# Patient Record
Sex: Female | Born: 1935
Health system: Southern US, Community
[De-identification: ages and names within clinical notes are randomized; demographics above are authoritative.]

## PROBLEM LIST (undated history)

## (undated) DIAGNOSIS — I10 Essential (primary) hypertension: Secondary | ICD-10-CM

## (undated) DIAGNOSIS — E785 Hyperlipidemia, unspecified: Secondary | ICD-10-CM

## (undated) DIAGNOSIS — Z853 Personal history of malignant neoplasm of breast: Secondary | ICD-10-CM

## (undated) DIAGNOSIS — C801 Malignant (primary) neoplasm, unspecified: Secondary | ICD-10-CM

## (undated) DIAGNOSIS — F0150 Vascular dementia without behavioral disturbance: Secondary | ICD-10-CM

## (undated) DIAGNOSIS — R32 Unspecified urinary incontinence: Secondary | ICD-10-CM

## (undated) HISTORY — PX: APPENDECTOMY: SHX54

## (undated) HISTORY — DX: Personal history of malignant neoplasm of breast: Z85.3

## (undated) HISTORY — DX: Unspecified urinary incontinence: R32

## (undated) HISTORY — PX: BREAST LUMPECTOMY: SHX2

---

## 2010-05-15 ENCOUNTER — Emergency Department: Payer: Self-pay | Admitting: Emergency Medicine

## 2011-01-29 ENCOUNTER — Emergency Department: Payer: Self-pay | Admitting: Emergency Medicine

## 2011-05-12 ENCOUNTER — Emergency Department: Payer: Self-pay | Admitting: Internal Medicine

## 2015-03-23 ENCOUNTER — Encounter: Payer: Self-pay | Admitting: Emergency Medicine

## 2015-03-23 ENCOUNTER — Emergency Department: Payer: Medicare Other

## 2015-03-23 ENCOUNTER — Observation Stay
Admission: EM | Admit: 2015-03-23 | Discharge: 2015-03-24 | Disposition: A | Discharge status: Home / Self Care | Payer: Medicare Other | Attending: Internal Medicine | Admitting: Internal Medicine

## 2015-03-23 DIAGNOSIS — Z66 Do not resuscitate: Secondary | ICD-10-CM | POA: Insufficient documentation

## 2015-03-23 DIAGNOSIS — Z809 Family history of malignant neoplasm, unspecified: Secondary | ICD-10-CM | POA: Insufficient documentation

## 2015-03-23 DIAGNOSIS — Z853 Personal history of malignant neoplasm of breast: Secondary | ICD-10-CM | POA: Insufficient documentation

## 2015-03-23 DIAGNOSIS — R55 Syncope and collapse: Secondary | ICD-10-CM | POA: Diagnosis present

## 2015-03-23 DIAGNOSIS — I10 Essential (primary) hypertension: Secondary | ICD-10-CM | POA: Diagnosis not present

## 2015-03-23 DIAGNOSIS — I6523 Occlusion and stenosis of bilateral carotid arteries: Secondary | ICD-10-CM | POA: Diagnosis not present

## 2015-03-23 DIAGNOSIS — R350 Frequency of micturition: Secondary | ICD-10-CM | POA: Insufficient documentation

## 2015-03-23 DIAGNOSIS — M79601 Pain in right arm: Secondary | ICD-10-CM | POA: Insufficient documentation

## 2015-03-23 DIAGNOSIS — Z79899 Other long term (current) drug therapy: Secondary | ICD-10-CM | POA: Insufficient documentation

## 2015-03-23 DIAGNOSIS — Z8249 Family history of ischemic heart disease and other diseases of the circulatory system: Secondary | ICD-10-CM | POA: Insufficient documentation

## 2015-03-23 DIAGNOSIS — E785 Hyperlipidemia, unspecified: Secondary | ICD-10-CM | POA: Diagnosis present

## 2015-03-23 HISTORY — DX: Hyperlipidemia, unspecified: E78.5

## 2015-03-23 HISTORY — DX: Essential (primary) hypertension: I10

## 2015-03-23 LAB — BASIC METABOLIC PANEL
Anion gap: 9 (ref 5–15)
BUN: 8 mg/dL (ref 6–20)
CO2: 28 mmol/L (ref 22–32)
Calcium: 9.3 mg/dL (ref 8.9–10.3)
Chloride: 97 mmol/L — ABNORMAL LOW (ref 101–111)
Creatinine, Ser: 0.69 mg/dL (ref 0.44–1.00)
GFR calc Af Amer: >60 mL/min (ref >60)
GFR calc non Af Amer: >60 mL/min (ref >60)
Glucose, Bld: 111 mg/dL — ABNORMAL HIGH (ref 65–99)
Potassium: 3.4 mmol/L — ABNORMAL LOW (ref 3.5–5.1)
Sodium: 134 mmol/L — ABNORMAL LOW (ref 135–145)

## 2015-03-23 LAB — CBC
HCT: 42.6 % (ref 35.0–47.0)
Hemoglobin: 13.9 g/dL (ref 12.0–16.0)
MCH: 28.4 pg (ref 26.0–34.0)
MCHC: 32.7 g/dL (ref 32.0–36.0)
MCV: 86.9 fL (ref 80.0–100.0)
Platelets: 234 10*3/uL (ref 150–440)
RBC: 4.9 MIL/uL (ref 3.80–5.20)
RDW: 14.2 % (ref 11.5–14.5)
WBC: 7.1 10*3/uL (ref 3.6–11.0)

## 2015-03-23 LAB — TROPONIN I: Troponin I: <0.03 ng/mL (ref <0.031)

## 2015-03-23 LAB — GLUCOSE, CAPILLARY: Glucose-Capillary: 96 mg/dL (ref 65–99)

## 2015-03-23 MED ORDER — LOSARTAN POTASSIUM 50 MG PO TABS
100.0000 mg | ORAL_TABLET | Freq: Every day | ORAL | Status: DC
  Administered 2015-03-24: 100 mg via ORAL
  Filled 2015-03-23: qty 2

## 2015-03-23 MED ORDER — ONDANSETRON HCL 4 MG PO TABS: 4.0000 mg | ORAL_TABLET | Freq: Four times a day (QID) | ORAL | Status: DC | PRN

## 2015-03-23 MED ORDER — SODIUM CHLORIDE 0.9 % IV SOLN
INTRAVENOUS | Status: AC
  Administered 2015-03-23: 23:00:00 via INTRAVENOUS

## 2015-03-23 MED ORDER — HYDROCHLOROTHIAZIDE 25 MG PO TABS
25.0000 mg | ORAL_TABLET | Freq: Every day | ORAL | Status: DC
  Administered 2015-03-24: 25 mg via ORAL
  Filled 2015-03-23: qty 1

## 2015-03-23 MED ORDER — ONDANSETRON HCL 4 MG/2ML IJ SOLN: 4.0000 mg | Freq: Four times a day (QID) | INTRAMUSCULAR | Status: DC | PRN

## 2015-03-23 MED ORDER — ENOXAPARIN SODIUM 40 MG/0.4ML ~~LOC~~ SOLN: 40.0000 mg | SUBCUTANEOUS | Status: DC

## 2015-03-23 MED ORDER — LOSARTAN POTASSIUM-HCTZ 100-25 MG PO TABS: 1.0000 | ORAL_TABLET | Freq: Every day | ORAL | Status: DC

## 2015-03-23 MED ORDER — ACETAMINOPHEN 325 MG PO TABS: 650.0000 mg | ORAL_TABLET | Freq: Four times a day (QID) | ORAL | Status: DC | PRN

## 2015-03-23 MED ORDER — ACETAMINOPHEN 650 MG RE SUPP: 650.0000 mg | Freq: Four times a day (QID) | RECTAL | Status: DC | PRN

## 2015-03-23 MED ORDER — SENNOSIDES-DOCUSATE SODIUM 8.6-50 MG PO TABS: 1.0000 | ORAL_TABLET | Freq: Every evening | ORAL | Status: DC | PRN

## 2015-03-23 MED ORDER — ATORVASTATIN CALCIUM 10 MG PO TABS: 10.0000 mg | ORAL_TABLET | Freq: Every day | ORAL | Status: DC

## 2015-03-23 MED ORDER — SODIUM CHLORIDE 0.9 % IJ SOLN
3.0000 mL | Freq: Two times a day (BID) | INTRAMUSCULAR | Status: DC
  Administered 2015-03-23: 3 mL via INTRAVENOUS

## 2015-03-23 NOTE — ED Notes (Signed)
Skin tear on right upper arm cleaned with sterile saline and dressed with tegaderm.  Laceration on right forearm cleaned with sterile saline and dressed with large adhesive bandage.

## 2015-03-23 NOTE — ED Notes (Signed)
Pt to ED via EMS from [employer] after syncopal episode, pt's spouse states pt hit her head on floor when she had syncopal episode, pt also obtained abrasion to right upper arm that was bandaged by EMS, pt a&o x 4 at present, states she has not medical hx

## 2015-03-23 NOTE — ED Provider Notes (Signed)
[facility] Emergency Department Provider Note  ____________________________________________  Time seen: Approximately 7:06 PM  I have reviewed the triage vital signs and the nursing notes.   HISTORY  Chief Complaint Loss of Consciousness    HPI Robin Lynn is a 81 y.o. female with HTN, history of breast cancer who presents for evaluation of sudden onset syncope without prodrome which occurred just prior to arrival, now resolved. The patient was walking with her husband at [facility] when she suddenly collapsed falling forward and hitting her head. She is also complaining of right forearm pain related to the fall. No neck pain. She is endorsing increased urinary frequency. She denies any chest pain, lightheadedness, nausea, flushing or shortness of breath preceding the event. She has otherwise been in her usual state of health. Currently she has mild pain in the right upper arm. It is worse with movement. She's never had anything like this before.    Past Medical History  Diagnosis Date  . Breast CA     There are no active problems to display for this patient.   Past Surgical History  Procedure Laterality Date  . Appendectomy      Current Outpatient Rx  Name  Route  Sig  Dispense  Refill  . atorvastatin (LIPITOR) 10 MG tablet   Oral   Take 10 mg by mouth daily.      3   . losartan-hydrochlorothiazide (HYZAAR) 100-25 MG per tablet   Oral   Take 1 tablet by mouth daily.      3     Allergies Review of patient's allergies indicates no known allergies.  No family history on file.  Social History Social History  Substance Use Topics  . Smoking status: Never Smoker   . Smokeless tobacco: None  . Alcohol Use: 0.6 oz/week    1 Glasses of wine per week    Review of Systems Constitutional: No fever/chills Eyes: No visual changes. ENT: No sore throat. Cardiovascular: Denies chest pain. Respiratory: Denies shortness of  breath. Gastrointestinal: No abdominal pain.  No nausea, no vomiting.  No diarrhea.  No constipation. Genitourinary: Negative for dysuria. Musculoskeletal: Negative for back pain. Skin: Negative for rash. Neurological: Negative for headaches, focal weakness or numbness.  10-point ROS otherwise negative.  ____________________________________________   PHYSICAL EXAM:  VITAL SIGNS: ED Triage Vitals  Enc Vitals Group     BP 03/23/15 1528 186/88 mmHg     Pulse Rate 03/23/15 1528 74     Resp --      Temp 03/23/15 1528 98.1 F (36.7 C)     Temp Source 03/23/15 1528 Oral     SpO2 03/23/15 1528 98 %     Weight 03/23/15 1528 160 lb (72.576 kg)     Height 03/23/15 1528 5\' 8"  (1.727 m)     Head Cir --      Peak Flow --      Pain Score 03/23/15 1528 8     Pain Loc --      Pain Edu? --      Excl. in [place]? --     Constitutional: Alert and oriented. Well appearing and in no acute distress. Eyes: Conjunctivae are normal. PERRL. EOMI. Head: Atraumatic. Nose: No congestion/rhinnorhea. Mouth/Throat: Mucous membranes are moist.  Oropharynx non-erythematous. Neck: No stridor.  No cervical spine tenderness to palpation. Cardiovascular: Normal rate, regular rhythm. Grossly normal heart sounds.  Good peripheral circulation. Respiratory: Normal respiratory effort.  No retractions. Lungs CTAB. Gastrointestinal: Soft and  nontender. No distention. No abdominal bruits. No CVA tenderness. Genitourinary: deferred Musculoskeletal: No lower extremity tenderness nor edema. Small skin tear associated with the right elbow but no bony tenderness or deformity, full range of motion at the right elbow, 2+ right radial pulse, small abrasion on the dorsum/ulnar side of the right distal forearm without any associated tenderness. Neurologic:  Normal speech and language. No gross focal neurologic deficits are appreciated. 5 out of 5 strength in bilateral upper and lower extremity, sensation intact to light touch  throughout, CN II-XII intact. Skin:  Skin is warm, dry and intact. No rash noted. Psychiatric: Mood and affect are normal. Speech and behavior are normal.  ____________________________________________   LABS (all labs ordered are listed, but only abnormal results are displayed)  Labs Reviewed  BASIC METABOLIC PANEL - Abnormal; Notable for the following:    Sodium 134 (*)    Potassium 3.4 (*)    Chloride 97 (*)    Glucose, Bld 111 (*)    All other components within normal limits  CBC  TROPONIN I  GLUCOSE, CAPILLARY  URINALYSIS COMPLETEWITH MICROSCOPIC ([facility] ONLY)  CBG MONITORING, ED   ____________________________________________  EKG  ED ECG REPORT I, Joanne Gavel, the attending physician, personally viewed and interpreted this ECG.   Date: 03/23/2015  EKG Time: 15:47  Rate: 72  Rhythm: normal sinus rhythm  Axis: normal  Intervals:none  ST&T Change: No acute ST elevation. Q-wave in lead 3  ____________________________________________  RADIOLOGY  CXR FINDINGS: Lungs clear. Heart size and pulmonary vascularity are within normal limits. There is atherosclerotic calcification in the aorta. No adenopathy. No pneumothorax. No bone lesions.  IMPRESSION: No edema or consolidation.  CT head  IMPRESSION: Severe age-related involutional change with no acute findings  Right humerus xray FINDINGS: There are minimal degenerate changes over the [facility] joint and glenohumeral joints. There is mild diffuse decreased bone mineralization. There is no acute fracture or dislocation.  IMPRESSION: No acute findings.   ____________________________________________   PROCEDURES  Procedure(s) performed: None  Critical Care performed: No  ____________________________________________   INITIAL IMPRESSION / ASSESSMENT AND PLAN / ED COURSE  Pertinent labs & imaging results that were available during my care of the patient were reviewed by me and considered in my medical  decision making (see chart for details).  Robin Lynn is a 81 y.o. female with HTN, history of breast cancer who presents for evaluation of sudden onset syncope without prodrome. On exam, she is very well-appearing in no acute distress. Vital signs stable, she is afebrile. Intact Neurological examination and her exam is only remarkable for small skin tears associated with the right elbow and the right distal forearm. She reports she has received a tetanus vaccine within the last 5 years. EKG reassuring as are basic labs, troponin negative however given sudden onset without prodrome, my concern is for possible transient arrhythmia/cardiogenic causes of syncope. For this reason, I discussed the case with the hospitalist, Dr. Margaretmary Eddy at 8:05 pm  for admission for telemetry/monitoring. Not consistent with acute neurogenic syncope, intact neurological exam, CT head shows no acute findings. Not consistent with vasovagal syncope. Urinalysis is pending at this time. ____________________________________________   FINAL CLINICAL IMPRESSION(S) / ED DIAGNOSES  Final diagnoses:  Syncope and collapse      Joanne Gavel, MD 03/23/15 2016

## 2015-03-23 NOTE — H&P (Signed)
[place] [facility] [place] NAME: Tanga Gloor    MR#:  [id number]  DATE OF BIRTH:  [birth year]  DATE OF ADMISSION:  03/23/2015  PRIMARY CARE PHYSICIAN: Thersa Salt, DO   REQUESTING/REFERRING PHYSICIAN: Edd Fabian, M.D.  CHIEF COMPLAINT:   Chief Complaint  Patient presents with  . Loss of Consciousness    HISTORY OF PRESENT ILLNESS:  Earl Zellmer  is a 81 y.o. female who presents with syncope. Patient states that she was walking in the grocery store with her husband she had syncopal episode. She and her husband state that they do not feel like she lost consciousness, or if she didn't suffer a very brief period of time as her husband states that he was telling her to speak to him, and she was confused very briefly but was able to converse with him. Patient states that she did not have any sensation of warning such as seeing spots, or feeling lightheaded. She denies any chest pain or shortness of breath or palpitations. She has never had an episode like this before. There is no reported seizure activity or postictal state, the patient did not lose control of her bowel and bladder. Patient states she currently feels fine, but would like to be evaluated for this episode. Hospitalists were called for admission for workup for evaluation of syncope. Initial labs and imaging in the ED were all largely benign.  PAST MEDICAL HISTORY:   Past Medical History  Diagnosis Date  . Breast CA   . HTN (hypertension)   . HLD (hyperlipidemia)     PAST SURGICAL HISTORY:   Past Surgical History  Procedure Laterality Date  . Appendectomy    . Breast lumpectomy Left     with radiation    SOCIAL HISTORY:   Social History  Substance Use Topics  . Smoking status: Never Smoker   . Smokeless tobacco: Not on file  . Alcohol Use: 0.6 oz/week    1 Glasses of wine per week    FAMILY HISTORY:   Family History  Problem Relation Age of Onset  . Cancer    . CAD  Father     DRUG ALLERGIES:  No Known Allergies  MEDICATIONS AT HOME:   Prior to Admission medications   Medication Sig Start Date End Date Taking? Authorizing Provider  atorvastatin (LIPITOR) 10 MG tablet Take 10 mg by mouth daily. 02/19/15  Yes Historical Provider, MD  losartan-hydrochlorothiazide (HYZAAR) 100-25 MG per tablet Take 1 tablet by mouth daily. 02/17/15  Yes Historical Provider, MD    REVIEW OF SYSTEMS:  Review of Systems  Constitutional: Negative for fever, chills, weight loss and malaise/fatigue.  HENT: Negative for ear pain, hearing loss and tinnitus.   Eyes: Negative for blurred vision, double vision, pain and redness.  Respiratory: Negative for cough, hemoptysis and shortness of breath.   Cardiovascular: Negative for chest pain, palpitations, orthopnea and leg swelling.  Gastrointestinal: Negative for nausea, vomiting, abdominal pain, diarrhea and constipation.  Genitourinary: Negative for dysuria, frequency and hematuria.  Musculoskeletal: Negative for back pain, joint pain and neck pain.  Skin:       No acne, rash, or lesions  Neurological: Negative for dizziness, tremors, focal weakness and weakness.       Syncope  Endo/Heme/Allergies: Negative for polydipsia. Does not bruise/bleed easily.  Psychiatric/Behavioral: Negative for depression. The patient is not nervous/anxious and does not have insomnia.      VITAL SIGNS:   Filed Vitals:   03/23/15  1528 03/23/15 1914 03/23/15 2000 03/23/15 2030  BP: 186/88 175/76 177/76 175/91  Pulse: 74 85 86 74  Temp: 98.1 F (36.7 C)     TempSrc: Oral     Resp:  16 15 16   Height: 5\' 8"  (1.727 m)     Weight: 72.576 kg (160 lb)     SpO2: 98% 99% 98% 95%   Wt Readings from Last 3 Encounters:  03/23/15 72.576 kg (160 lb)    PHYSICAL EXAMINATION:  Physical Exam  Vitals reviewed. Constitutional: She is oriented to person, place, and time. She appears well-developed and well-nourished. No distress.  HENT:  Head:  Normocephalic and atraumatic.  Mouth/Throat: Oropharynx is clear and moist.  Eyes: Conjunctivae and EOM are normal. Pupils are equal, round, and reactive to light. No scleral icterus.  Neck: Normal range of motion. Neck supple. No JVD present. No thyromegaly present.  Cardiovascular: Normal rate, regular rhythm and intact distal pulses.  Exam reveals no gallop and no friction rub.   No murmur heard. Respiratory: Effort normal and breath sounds normal. No respiratory distress. She has no wheezes. She has no rales.  GI: Soft. Bowel sounds are normal. She exhibits no distension. There is no tenderness.  Musculoskeletal: Normal range of motion. She exhibits no edema.  No arthritis, no gout  Lymphadenopathy:    She has no cervical adenopathy.  Neurological: She is alert and oriented to person, place, and time. No cranial nerve deficit.  No dysarthria, no aphasia  Skin: Skin is warm and dry. No rash noted. No erythema.  Psychiatric: She has a normal mood and affect. Her behavior is normal. Judgment and thought content normal.    LABORATORY PANEL:   CBC  Recent Labs Lab 03/23/15 1553  WBC 7.1  HGB 13.9  HCT 42.6  PLT 234   ------------------------------------------------------------------------------------------------------------------  Chemistries   Recent Labs Lab 03/23/15 1553  NA 134*  K 3.4*  CL 97*  CO2 28  GLUCOSE 111*  BUN 8  CREATININE 0.69  CALCIUM 9.3   ------------------------------------------------------------------------------------------------------------------  Cardiac Enzymes  Recent Labs Lab 03/23/15 1552  TROPONINI <0.03   ------------------------------------------------------------------------------------------------------------------  RADIOLOGY:  Ct Head Wo Contrast  03/23/2015   CLINICAL DATA:  Loss of consciousness at the grocery store and fell hit right side of head on floor, due to syncope  EXAM: CT HEAD WITHOUT CONTRAST  TECHNIQUE:  Contiguous axial images were obtained from the base of the skull through the vertex without intravenous contrast.  COMPARISON:  01/29/2011  FINDINGS: Severe diffuse atrophy and low attenuation in the deep white matter. No evidence of mass or vascular territory infarct. No hydrocephalus. No hemorrhage or extra-axial fluid. No skull fracture.  IMPRESSION: Severe age-related involutional change with no acute findings   Electronically Signed   By: Skipper Cliche M.D.   On: 03/23/2015 16:12   Dg Chest Portable 1 View  03/23/2015   CLINICAL DATA:  Pain following fall  EXAM: PORTABLE CHEST - 1 VIEW  COMPARISON:  May 15, 2010  FINDINGS: Lungs clear. Heart size and pulmonary vascularity are within normal limits. There is atherosclerotic calcification in the aorta. No adenopathy. No pneumothorax. No bone lesions.  IMPRESSION: No edema or consolidation.   Electronically Signed   By: Lowella Grip III M.D.   On: 03/23/2015 19:43   Dg Humerus Right  03/23/2015   CLINICAL DATA:  Fall today with right arm pain upper arm to elbow.  EXAM: RIGHT HUMERUS - 2+ VIEW  COMPARISON:  None.  FINDINGS:  There are minimal degenerate changes over the [facility] joint and glenohumeral joints. There is mild diffuse decreased bone mineralization. There is no acute fracture or dislocation.  IMPRESSION: No acute findings.   Electronically Signed   By: Marin Olp M.D.   On: 03/23/2015 16:31    EKG:   Orders placed or performed during the hospital encounter of 03/23/15  . ED EKG  . ED EKG  . EKG 12-Lead  . EKG 12-Lead    IMPRESSION AND PLAN:  Principal Problem:   Syncope - monitor with telemetry, check TSH, trend cardiac enzymes, get echocardiogram, get cardiology consult. Active Problems:   HTN (hypertension) - mildly elevated at this time, continue home meds and IV when necessary antihypertensives if needed   HLD (hyperlipidemia) - continue home dose statin  All the records are reviewed and case discussed with ED  provider. Management plans discussed with the patient and/or family.  DVT PROPHYLAXIS: SubQ lovenox  ADMISSION STATUS: Observation  CODE STATUS: DO NOT RESUSCITATE  TOTAL TIME TAKING CARE OF THIS PATIENT: 45 minutes.    Michaelyn Wall Weott 03/23/2015, 8:51 PM  [facility]  Office  [phone]  CC: Primary care physician; Thersa Salt, DO

## 2015-03-24 ENCOUNTER — Ambulatory Visit: Payer: Self-pay | Admitting: Family Medicine

## 2015-03-24 ENCOUNTER — Observation Stay: Admit: 2015-03-24 | Payer: Medicare Other

## 2015-03-24 ENCOUNTER — Observation Stay: Payer: Medicare Other

## 2015-03-24 ENCOUNTER — Telehealth: Payer: Self-pay

## 2015-03-24 ENCOUNTER — Telehealth: Payer: Self-pay | Admitting: *Deleted

## 2015-03-24 LAB — URINALYSIS COMPLETE WITH MICROSCOPIC (ARMC ONLY)
Bacteria, UA: NONE SEEN
Bilirubin Urine: NEGATIVE
Glucose, UA: NEGATIVE mg/dL
Hgb urine dipstick: NEGATIVE
Ketones, ur: NEGATIVE mg/dL
Nitrite: NEGATIVE
Protein, ur: NEGATIVE mg/dL
Specific Gravity, Urine: 1.002 — ABNORMAL LOW (ref 1.005–1.030)
pH: 8 (ref 5.0–8.0)

## 2015-03-24 LAB — CBC
HCT: 41.7 % (ref 35.0–47.0)
Hemoglobin: 13.7 g/dL (ref 12.0–16.0)
MCH: 28.3 pg (ref 26.0–34.0)
MCHC: 32.9 g/dL (ref 32.0–36.0)
MCV: 86 fL (ref 80.0–100.0)
Platelets: 230 10*3/uL (ref 150–440)
RBC: 4.84 MIL/uL (ref 3.80–5.20)
RDW: 14 % (ref 11.5–14.5)
WBC: 8.8 10*3/uL (ref 3.6–11.0)

## 2015-03-24 LAB — BASIC METABOLIC PANEL
Anion gap: 6 (ref 5–15)
BUN: 6 mg/dL (ref 6–20)
CO2: 27 mmol/L (ref 22–32)
Calcium: 8.8 mg/dL — ABNORMAL LOW (ref 8.9–10.3)
Chloride: 104 mmol/L (ref 101–111)
Creatinine, Ser: 0.66 mg/dL (ref 0.44–1.00)
GFR calc Af Amer: >60 mL/min (ref >60)
GFR calc non Af Amer: >60 mL/min (ref >60)
Glucose, Bld: 102 mg/dL — ABNORMAL HIGH (ref 65–99)
Potassium: 3.7 mmol/L (ref 3.5–5.1)
Sodium: 137 mmol/L (ref 135–145)

## 2015-03-24 LAB — TROPONIN I
Troponin I: <0.03 ng/mL (ref <0.031)
Troponin I: <0.03 ng/mL (ref <0.031)
Troponin I: <0.03 ng/mL (ref <0.031)

## 2015-03-24 LAB — TSH: TSH: 2.555 u[IU]/mL (ref 0.350–4.500)

## 2015-03-24 MED ORDER — LOSARTAN POTASSIUM 100 MG PO TABS: 100.0000 mg | ORAL_TABLET | Freq: Every day | ORAL | Status: DC

## 2015-03-24 NOTE — Telephone Encounter (Signed)
Patient will D/C from hospital 09/16 appt office visit scheduled 09/19 -Thanks

## 2015-03-24 NOTE — Progress Notes (Signed)
Robin Lynn is a 81 y.o. female  [id number]  Primary Cardiologist: Neoma Laming Reason for Consultation: Syncopal episode  HPI: This is a 81 year old pleasant white female who presented to the emergency room after having an episode while she was shopping at a grocery store apparently passed out. She is at currently alert and oriented and denies any chest pain shortness of breath or dizziness. She had no prior episodes of something like this happening and she says she is not diabetic and she had dinner and lunch as well as breakfast yesterday on time.   Review of Systems: Review of Systems  Cardiovascular: Negative for chest pain and orthopnea.  Neurological: Positive for loss of consciousness.  All other systems reviewed and are negative.     Past Medical History  Diagnosis Date  . Breast CA   . HTN (hypertension)   . HLD (hyperlipidemia)     Medications Prior to Admission  Medication Sig Dispense Refill  . atorvastatin (LIPITOR) 10 MG tablet Take 10 mg by mouth daily.  3  . losartan-hydrochlorothiazide (HYZAAR) 100-25 MG per tablet Take 1 tablet by mouth daily.  3     . atorvastatin  10 mg Oral QHS  . enoxaparin (LOVENOX) injection  40 mg Subcutaneous Q24H  . losartan  100 mg Oral Daily   And  . hydrochlorothiazide  25 mg Oral Daily  . sodium chloride  3 mL Intravenous Q12H    Infusions: . sodium chloride 50 mL/hr at 03/23/15 2304    No Known Allergies  Social History   Social History  . Marital Status: Married    Spouse Name: N/A  . Number of Children: N/A  . Years of Education: N/A   Occupational History  . Not on file.   Social History Main Topics  . Smoking status: Never Smoker   . Smokeless tobacco: Not on file  . Alcohol Use: 0.6 oz/week    1 Glasses of wine per week  . Drug Use: No  . Sexual Activity: Not on file   Other Topics Concern  . Not on file   Social History Narrative  . No narrative on file    Family History  Problem  Relation Age of Onset  . Cancer    . CAD Father     PHYSICAL EXAM: Filed Vitals:   03/24/15 0802  BP: 175/67  Pulse: 77  Temp:   Resp: 17     Intake/Output Summary (Last 24 hours) at 03/24/15 0907 Last data filed at 03/24/15 0842  Gross per 24 hour  Intake      0 ml  Output    975 ml  Net   -975 ml    General:  Well appearing. No respiratory difficulty HEENT: normal Neck: supple. no JVD. Carotids 2+ bilat; no bruits. No lymphadenopathy or thryomegaly appreciated. Cor: PMI nondisplaced. Regular rate & rhythm. No rubs, gallops or murmurs. Lungs: clear Abdomen: soft, nontender, nondistended. No hepatosplenomegaly. No bruits or masses. Good bowel sounds. Extremities: no cyanosis, clubbing, rash, edema Neuro: alert & oriented x 3, cranial nerves grossly intact. moves all 4 extremities w/o difficulty. Affect pleasant.  ECG: KG revealed normal sinus rhythm 72 bpm with some ST depression in the inferolateral leads  Results for orders placed or performed during the hospital encounter of 03/23/15 (from the past 24 hour(s))  Troponin I     Status: None   Collection Time: 03/23/15  3:52 PM  Result Value Ref Range   Troponin I <  0.03 <0.031 ng/mL  Glucose, capillary     Status: None   Collection Time: 03/23/15  3:52 PM  Result Value Ref Range   Glucose-Capillary 96 65 - 99 mg/dL  Basic metabolic panel     Status: Abnormal   Collection Time: 03/23/15  3:53 PM  Result Value Ref Range   Sodium 134 (L) 135 - 145 mmol/L   Potassium 3.4 (L) 3.5 - 5.1 mmol/L   Chloride 97 (L) 101 - 111 mmol/L   CO2 28 22 - 32 mmol/L   Glucose, Bld 111 (H) 65 - 99 mg/dL   BUN 8 6 - 20 mg/dL   Creatinine, Ser 0.69 0.44 - 1.00 mg/dL   Calcium 9.3 8.9 - 10.3 mg/dL   GFR calc non Af Amer >60 >60 mL/min   GFR calc Af Amer >60 >60 mL/min   Anion gap 9 5 - 15  CBC     Status: None   Collection Time: 03/23/15  3:53 PM  Result Value Ref Range   WBC 7.1 3.6 - 11.0 K/uL   RBC 4.90 3.80 - 5.20 MIL/uL    Hemoglobin 13.9 12.0 - 16.0 g/dL   HCT 42.6 35.0 - 47.0 %   MCV 86.9 80.0 - 100.0 fL   MCH 28.4 26.0 - 34.0 pg   MCHC 32.7 32.0 - 36.0 g/dL   RDW 14.2 11.5 - 14.5 %   Platelets 234 150 - 440 K/uL  TSH     Status: None   Collection Time: 03/24/15 12:03 AM  Result Value Ref Range   TSH 2.555 0.350 - 4.500 uIU/mL  Troponin I     Status: None   Collection Time: 03/24/15 12:03 AM  Result Value Ref Range   Troponin I <0.03 <0.031 ng/mL  Urinalysis complete, with microscopic ([facility] only)     Status: Abnormal   Collection Time: 03/24/15  1:36 AM  Result Value Ref Range   Color, Urine COLORLESS (A) YELLOW   APPearance CLEAR (A) CLEAR   Glucose, UA NEGATIVE NEGATIVE mg/dL   Bilirubin Urine NEGATIVE NEGATIVE   Ketones, ur NEGATIVE NEGATIVE mg/dL   Specific Gravity, Urine 1.002 (L) 1.005 - 1.030   Hgb urine dipstick NEGATIVE NEGATIVE   pH 8.0 5.0 - 8.0   Protein, ur NEGATIVE NEGATIVE mg/dL   Nitrite NEGATIVE NEGATIVE   Leukocytes, UA TRACE (A) NEGATIVE   RBC / HPF 0-5 0 - 5 RBC/hpf   WBC, UA 0-5 0 - 5 WBC/hpf   Bacteria, UA NONE SEEN NONE SEEN   Squamous Epithelial / LPF 0-5 (A) NONE SEEN  Basic metabolic panel     Status: Abnormal   Collection Time: 03/24/15  4:29 AM  Result Value Ref Range   Sodium 137 135 - 145 mmol/L   Potassium 3.7 3.5 - 5.1 mmol/L   Chloride 104 101 - 111 mmol/L   CO2 27 22 - 32 mmol/L   Glucose, Bld 102 (H) 65 - 99 mg/dL   BUN 6 6 - 20 mg/dL   Creatinine, Ser 0.66 0.44 - 1.00 mg/dL   Calcium 8.8 (L) 8.9 - 10.3 mg/dL   GFR calc non Af Amer >60 >60 mL/min   GFR calc Af Amer >60 >60 mL/min   Anion gap 6 5 - 15  CBC     Status: None   Collection Time: 03/24/15  4:29 AM  Result Value Ref Range   WBC 8.8 3.6 - 11.0 K/uL   RBC 4.84 3.80 - 5.20 MIL/uL   Hemoglobin  13.7 12.0 - 16.0 g/dL   HCT 41.7 35.0 - 47.0 %   MCV 86.0 80.0 - 100.0 fL   MCH 28.3 26.0 - 34.0 pg   MCHC 32.9 32.0 - 36.0 g/dL   RDW 14.0 11.5 - 14.5 %   Platelets 230 150 - 440 K/uL  Troponin  I     Status: None   Collection Time: 03/24/15  4:29 AM  Result Value Ref Range   Troponin I <0.03 <0.031 ng/mL   Ct Head Wo Contrast  03/23/2015   CLINICAL DATA:  Loss of consciousness at the grocery store and fell hit right side of head on floor, due to syncope  EXAM: CT HEAD WITHOUT CONTRAST  TECHNIQUE: Contiguous axial images were obtained from the base of the skull through the vertex without intravenous contrast.  COMPARISON:  01/29/2011  FINDINGS: Severe diffuse atrophy and low attenuation in the deep white matter. No evidence of mass or vascular territory infarct. No hydrocephalus. No hemorrhage or extra-axial fluid. No skull fracture.  IMPRESSION: Severe age-related involutional change with no acute findings   Electronically Signed   By: Skipper Cliche M.D.   On: 03/23/2015 16:12   Dg Chest Portable 1 View  03/23/2015   CLINICAL DATA:  Pain following fall  EXAM: PORTABLE CHEST - 1 VIEW  COMPARISON:  May 15, 2010  FINDINGS: Lungs clear. Heart size and pulmonary vascularity are within normal limits. There is atherosclerotic calcification in the aorta. No adenopathy. No pneumothorax. No bone lesions.  IMPRESSION: No edema or consolidation.   Electronically Signed   By: Lowella Grip III M.D.   On: 03/23/2015 19:43   Dg Humerus Right  03/23/2015   CLINICAL DATA:  Fall today with right arm pain upper arm to elbow.  EXAM: RIGHT HUMERUS - 2+ VIEW  COMPARISON:  None.  FINDINGS: There are minimal degenerate changes over the [facility] joint and glenohumeral joints. There is mild diffuse decreased bone mineralization. There is no acute fracture or dislocation.  IMPRESSION: No acute findings.   Electronically Signed   By: Marin Olp M.D.   On: 03/23/2015 16:31     ASSESSMENT AND PLAN: Syncopal episode etiology of it is unclear EKG does have some inferolateral ST depression. First troponin is negative and patient is a DO NOT RESUSCITATE. Advise neurological workup and if that's negative will do  further cardiac workup. If she rules out for myocardial infarction will do outpatient stress test.  KHAN,SHAUKAT A

## 2015-03-24 NOTE — Telephone Encounter (Signed)
Attempted to complete TCM call, unable to leave message.

## 2015-03-24 NOTE — Discharge Summary (Signed)
[facility] NAME: Robin Lynn    MR#:  [id number]  DATE OF BIRTH:  May 27, 1919  DATE OF ADMISSION:  03/23/2015 ADMITTING PHYSICIAN: Lance Coon, MD  DATE OF DISCHARGE: 03/24/2015  PRIMARY CARE PHYSICIAN: Thersa Salt, DO    ADMISSION DIAGNOSIS:  Syncope and collapse [R55]  DISCHARGE DIAGNOSIS:  Principal Problem:   Syncope Active Problems:   HTN (hypertension)   HLD (hyperlipidemia)   SECONDARY DIAGNOSIS:   Past Medical History  Diagnosis Date  . Breast CA   . HTN (hypertension)   . HLD (hyperlipidemia)     HOSPITAL COURSE:   89y/oF with hypertension and hyperlipidemia, not taking any medications at home presents to the hospital secondary to a presyncopal episode  #1 syncope/presyncope-husband who was at her side when she passed out confirms that patient did not lose consciousness. She felt the aura that she was going to pass out and fell to the floor. -Likely vasovagal episode. -Blood pressure has been on the higher side here in the hospital. And patient did confirm that she is not taking anything for blood pressure. -Echocardiogram as outpatient. Troponins are negative. -Carotid Dopplers pending. -Appreciate cardiac consult. Less likely to be cardiogenic in nature. Ambulate patient, is doing well can be discharged home and outpatient follow-up recommended. -We'll start medication for her hypertension.  #2 hypertension-not taking any medications at home. -Discharge on Cozaar as she has used it in the past. -PCP follow-up next week  #3 hyperlipidemia-continue her statin  Possible discharge today  DISCHARGE CONDITIONS:   Stable  CONSULTS OBTAINED:  Treatment Team:  Dionisio David, MD Barnabas Harries, PA-C  DRUG ALLERGIES:  No Known Allergies  DISCHARGE MEDICATIONS:   Current Discharge Medication List    START taking these medications   Details  losartan (COZAAR) 100 MG tablet Take 1 tablet  (100 mg total) by mouth daily. Qty: 30 tablet, Refills: 0      CONTINUE these medications which have NOT CHANGED   Details  atorvastatin (LIPITOR) 10 MG tablet Take 10 mg by mouth daily. Refills: 3      STOP taking these medications     losartan-hydrochlorothiazide (HYZAAR) 100-25 MG per tablet          DISCHARGE INSTRUCTIONS:   1. PCP follow-up in 1 week  If you experience worsening of your admission symptoms, develop shortness of breath, life threatening emergency, suicidal or homicidal thoughts you must seek medical attention immediately by calling 911 or calling your MD immediately  if symptoms less severe.  You Must read complete instructions/literature along with all the possible adverse reactions/side effects for all the Medicines you take and that have been prescribed to you. Take any new Medicines after you have completely understood and accept all the possible adverse reactions/side effects.   Please note  You were cared for by a hospitalist during your hospital stay. If you have any questions about your discharge medications or the care you received while you were in the hospital after you are discharged, you can call the unit and asked to speak with the hospitalist on call if the hospitalist that took care of you is not available. Once you are discharged, your primary care physician will handle any further medical issues. Please note that NO REFILLS for any discharge medications will be authorized once you are discharged, as it is imperative that you return to your primary care physician (or establish a relationship with a primary care physician  if you do not have one) for your aftercare needs so that they can reassess your need for medications and monitor your lab values.    Today   CHIEF COMPLAINT:   Chief Complaint  Patient presents with  . Loss of Consciousness    VITAL SIGNS:  Blood pressure 175/67, pulse 77, temperature 98.4 F (36.9 C), temperature source  Oral, resp. rate 17, height 5\' 8"  (1.727 m), weight 60.737 kg (133 lb 14.4 oz), SpO2 100 %.  I/O:   Intake/Output Summary (Last 24 hours) at 03/24/15 1110 Last data filed at 03/24/15 1032  Gross per 24 hour  Intake    240 ml  Output   1275 ml  Net  -1035 ml    PHYSICAL EXAMINATION:   Physical Exam  GENERAL:  81 y.o.-year-old patient sitting in the bed with no acute distress.  EYES: Pupils equal, round, reactive to light and accommodation. No scleral icterus. Extraocular muscles intact.  HEENT: Head atraumatic, normocephalic. Oropharynx and nasopharynx clear.  NECK:  Supple, no jugular venous distention. No thyroid enlargement, no tenderness.  LUNGS: Normal breath sounds bilaterally, no wheezing, rales,rhonchi or crepitation. No use of accessory muscles of respiration.  CARDIOVASCULAR: S1, S2 normal. No rubs, or gallops.  3/6 systolic murmur is present ABDOMEN: Soft, non-tender, non-distended. Bowel sounds present. No organomegaly or mass.  EXTREMITIES: No pedal edema, cyanosis, or clubbing.  NEUROLOGIC: Cranial nerves II through XII are intact. Muscle strength 5/5 in all extremities. Sensation intact. Gait not checked.  PSYCHIATRIC: The patient is alert and oriented x 3.  SKIN: No obvious rash, lesion, or ulcer.   DATA REVIEW:   CBC  Recent Labs Lab 03/24/15 0429  WBC 8.8  HGB 13.7  HCT 41.7  PLT 230    Chemistries   Recent Labs Lab 03/24/15 0429  NA 137  K 3.7  CL 104  CO2 27  GLUCOSE 102*  BUN 6  CREATININE 0.66  CALCIUM 8.8*    Cardiac Enzymes  Recent Labs Lab 03/24/15 0429  TROPONINI <0.03    Microbiology Results  No results found for this or any previous visit.  RADIOLOGY:  Ct Head Wo Contrast  03/23/2015   CLINICAL DATA:  Loss of consciousness at the grocery store and fell hit right side of head on floor, due to syncope  EXAM: CT HEAD WITHOUT CONTRAST  TECHNIQUE: Contiguous axial images were obtained from the base of the skull through the  vertex without intravenous contrast.  COMPARISON:  01/29/2011  FINDINGS: Severe diffuse atrophy and low attenuation in the deep white matter. No evidence of mass or vascular territory infarct. No hydrocephalus. No hemorrhage or extra-axial fluid. No skull fracture.  IMPRESSION: Severe age-related involutional change with no acute findings   Electronically Signed   By: Skipper Cliche M.D.   On: 03/23/2015 16:12   Dg Chest Portable 1 View  03/23/2015   CLINICAL DATA:  Pain following fall  EXAM: PORTABLE CHEST - 1 VIEW  COMPARISON:  May 15, 2010  FINDINGS: Lungs clear. Heart size and pulmonary vascularity are within normal limits. There is atherosclerotic calcification in the aorta. No adenopathy. No pneumothorax. No bone lesions.  IMPRESSION: No edema or consolidation.   Electronically Signed   By: Lowella Grip III M.D.   On: 03/23/2015 19:43   Dg Humerus Right  03/23/2015   CLINICAL DATA:  Fall today with right arm pain upper arm to elbow.  EXAM: RIGHT HUMERUS - 2+ VIEW  COMPARISON:  None.  FINDINGS: There  are minimal degenerate changes over the [facility] joint and glenohumeral joints. There is mild diffuse decreased bone mineralization. There is no acute fracture or dislocation.  IMPRESSION: No acute findings.   Electronically Signed   By: Marin Olp M.D.   On: 03/23/2015 16:31    EKG:   Orders placed or performed during the hospital encounter of 03/23/15  . ED EKG  . ED EKG  . EKG 12-Lead  . EKG 12-Lead      Management plans discussed with the patient, family and they are in agreement.  CODE STATUS:     Code Status Orders        Start     Ordered   03/23/15 2224  Do not attempt resuscitation (DNR)   Continuous    Question Answer Comment  In the event of cardiac or respiratory ARREST Do not call a "code blue"   In the event of cardiac or respiratory ARREST Do not perform Intubation, CPR, defibrillation or ACLS   In the event of cardiac or respiratory ARREST Use medication by  any route, position, wound care, and other measures to relive pain and suffering. May use oxygen, suction and manual treatment of airway obstruction as needed for comfort.      03/23/15 2223      TOTAL TIME TAKING CARE OF THIS PATIENT: 37 minutes.    Gladstone Lighter M.D on 03/24/2015 at 11:10 AM  Between 7am to 6pm - Pager - [phone]  After 6pm go to [email or web address] - password [email or web address] [email or web address]  Office  [phone]  CC: Primary care physician; Thersa Salt, DO

## 2015-03-24 NOTE — Progress Notes (Signed)
Pt is a&o, VSS, NSR on tele with no complaints of pain or discomfort. US carotids ordered and pt ambulated in hall with ease per MD request. O2 sats remained above 95%. Discharge instructions and prescription given to pt and husband with verbal acknowledgment of understanding. Pt awaiting wheelchair from volunteer services.

## 2015-03-24 NOTE — Telephone Encounter (Signed)
Attempted to call patient, not able to leave message, Oran Rein, RN   Transition Care Management Follow-up Telephone Call  How have you been since you were released from the hospital?   Do you understand why you were in the hospital?    Do you understand the discharge instrcutions?   Items Reviewed:  Medications reviewed:   Allergies reviewed:   Dietary changes reviewed  Referrals reviewed:    Functional Questionnaire:   Activities of Daily Living (ADLs):   She states they are independent in the following: States they require assistance with the following:   Any transportation issues/concerns?: {   Any patient concerns?    Confirmed importance and date/time of follow-up visits scheduled:    Confirmed with patient if condition begins to worsen call PCP or go to the ER.  Patient was given the Call-a-Nurse line [phone]:

## 2015-03-27 ENCOUNTER — Encounter (INDEPENDENT_AMBULATORY_CARE_PROVIDER_SITE_OTHER): Payer: Self-pay

## 2015-03-27 ENCOUNTER — Encounter: Payer: Self-pay | Admitting: Family Medicine

## 2015-03-27 ENCOUNTER — Telehealth: Payer: Self-pay

## 2015-03-27 ENCOUNTER — Ambulatory Visit: Payer: Self-pay | Admitting: Family Medicine

## 2015-03-27 ENCOUNTER — Ambulatory Visit (INDEPENDENT_AMBULATORY_CARE_PROVIDER_SITE_OTHER): Payer: Medicare Other | Admitting: Family Medicine

## 2015-03-27 VITALS — BP 130/70 | HR 67 | Temp 98.6°F | Ht 65.5 in | Wt 134.0 lb

## 2015-03-27 DIAGNOSIS — R55 Syncope and collapse: Secondary | ICD-10-CM | POA: Diagnosis not present

## 2015-03-27 NOTE — Telephone Encounter (Signed)
Transition Care Management Follow-up Telephone Call   Date discharged? 03/24/15   How have you been since you were released from the hospital? Doing okay and I am getting around with my walker.   Do you understand why you were in the hospital? Yes   Do you understand the discharge instructions? Yes   Where were you discharged to? Home   Items Reviewed:  Medications reviewed: Yes  Allergies reviewed: Yes  Dietary changes reviewed: Yes  Referrals reviewed: Yes   Functional Questionnaire:   Activities of Daily Living (ADLs):   She states they are independent in the following: Independent with ADLs except ambulating. States they require assistance with the following: Ambulates with a walker.   Any transportation issues/concerns?: No.   Any patient concerns? Not at this time.   Confirmed importance and date/time of follow-up visits scheduled Yes, confirmed appointment for 03/27/15.  Provider Appointment booked with Dr. Lacinda Axon (PCP).  Confirmed with patient if condition begins to worsen call PCP or go to the ER.  Patient was given the office number and encouraged to call back with question or concerns.  : Yes, patient verbalized understanding.

## 2015-03-27 NOTE — Assessment & Plan Note (Signed)
Her hospital course has been reviewed in detail. I discussed her hospital course with her as well as her current medications. I informed her of the results from her tests/workup in the hospital. The hospital record differs from patient and husbands report of the events.  They state that she had a definite loss of consciousness. Her workup in the hospital was negative. She is in need of an outpatient echocardiogram and stress test per the EMR. There is some concern of cardiogenic syncope given her history and thus will refer to cardiology for further evaluation and management.

## 2015-03-27 NOTE — Progress Notes (Signed)
Pre visit review using our clinic review tool, if applicable. No additional management support is needed unless otherwise documented below in the visit note. 

## 2015-03-27 NOTE — Progress Notes (Signed)
Subjective:  Patient ID: Robin Lynn, female    DOB: 01/27/1919  Age: 81 y.o. MRN: [id number]  CC: Hospital follow up.  HPI Robin Lynn is a 81 y.o. female presents to the clinic today for hospital follow up.  Patient was admitted from 9/15 to 9/16. I have reviewed the hospital course in detail. Course is outlined below. Patient was admitted for presyncope/syncope. Was thought to be vasovagal. She was seen by cardiology who recommended trending troponins and if negative outpatient stress test. She was discharged home in stable condition.    Patient presents today for follow-up. Patient and her husband report that (although this differs from the discharge summary) that she indeed think he had a loss of consciousness. No proceeding aura or symptomatology prior to syncope. They report that they were at [facility] shopping and were going to check out and while she was standing she passed out. Patient reports she had a brief loss of consciousness and was taken to the hospital for evaluation. Since her hospitalization she states that she is feeling okay but is taking more measures to prevent falls. She denies any chest pain, shortness of breath, dizziness, lightheadedness. She does report that she's had some "pressure in her head".  No exacerbating or relieving factors. Additionally, she reports that she has had a skin tear of her right elbow and would like it redressed today.  PMH, Surgical Hx, Family Hx, Social History reviewed and updated as below. Past Medical History  Diagnosis Date  . Breast CA   . HTN (hypertension)   . HLD (hyperlipidemia)   . Urinary incontinence    Past Surgical History  Procedure Laterality Date  . Appendectomy    . Breast lumpectomy Left     with radiation   Family History  Problem Relation Age of Onset  . Cancer    . CAD Father    Social History  Substance Use Topics  . Smoking status: Never Smoker   . Smokeless tobacco: Never Used  . Alcohol Use: 0.6  oz/week    1 Glasses of wine per week   Review of Systems  Constitutional: Negative for fever and chills.  Eyes: Positive for visual disturbance.  Respiratory: Negative for shortness of breath.   Cardiovascular: Negative for chest pain and leg swelling.  Gastrointestinal: Negative for nausea and vomiting.  Genitourinary:       Incontinence.   Musculoskeletal: Negative.   Skin:       Patient has a skin tear from fall.  Neurological: Positive for headaches. Negative for dizziness and light-headedness.  Psychiatric/Behavioral: Negative.    Objective:   Today's Vitals: BP 130/70 mmHg  Pulse 67  Temp(Src) 98.6 F (37 C) (Oral)  Ht 5' 5.5" (1.664 m)  Wt 134 lb (60.782 kg)  BMI 21.95 kg/m2  SpO2 98%  Physical Exam  Constitutional: She appears well-developed and well-nourished.  Elderly female in no acute distress.  HENT:  Head: Normocephalic and atraumatic.  Mouth/Throat: Oropharynx is clear and moist.  Eyes: No scleral icterus.  Neck: Neck supple. No thyromegaly present.  Cardiovascular: Normal rate and regular rhythm.   Soft systolic murmur.  Pulmonary/Chest: Effort normal and breath sounds normal. No respiratory distress. She has no wheezes. She has no rales.  Abdominal: Soft. She exhibits no distension. There is no tenderness. There is no rebound and no guarding.  Lymphadenopathy:    She has no cervical adenopathy.  Neurological: She is alert.  Skin:  Small skin tear noted on the  lateral elbow.  Psychiatric: She has a normal mood and affect.  Vitals reviewed.  Assessment & Plan:   Problem List Items Addressed This Visit    Syncope - Primary    Her hospital course has been reviewed in detail. I discussed her hospital course with her as well as her current medications. I informed her of the results from her tests/workup in the hospital. The hospital record differs from patient and husbands report of the events.  They state that she had a definite loss of  consciousness. Her workup in the hospital was negative. She is in need of an outpatient echocardiogram and stress test per the EMR. There is some concern of cardiogenic syncope given her history and thus will refer to cardiology for further evaluation and management.      Relevant Medications   losartan-hydrochlorothiazide (HYZAAR) 100-25 MG per tablet   Other Relevant Orders   Ambulatory referral to Cardiology     Outpatient Encounter Prescriptions as of 03/27/2015  Medication Sig  . atorvastatin (LIPITOR) 10 MG tablet Take 10 mg by mouth daily.  Marland Kitchen losartan (COZAAR) 100 MG tablet Take 1 tablet (100 mg total) by mouth daily. (Patient not taking: Reported on 03/27/2015)  . losartan-hydrochlorothiazide (HYZAAR) 100-25 MG per tablet Take 1 tablet by mouth daily.   No facility-administered encounter medications on file as of 03/27/2015.    Follow-up: ~ 2 weeks.   Coral Spikes DO

## 2015-03-27 NOTE — Patient Instructions (Signed)
It was nice to see you today.  Follow up later this month so we can check up and see how you're doing.  Take care  Dr. Lacinda Axon

## 2015-04-05 ENCOUNTER — Ambulatory Visit (INDEPENDENT_AMBULATORY_CARE_PROVIDER_SITE_OTHER): Payer: Medicare Other | Admitting: Cardiovascular Disease

## 2015-04-05 ENCOUNTER — Encounter: Payer: Self-pay | Admitting: Cardiovascular Disease

## 2015-04-05 VITALS — BP 159/69 | HR 66 | Ht 65.0 in | Wt 132.5 lb

## 2015-04-05 DIAGNOSIS — R55 Syncope and collapse: Secondary | ICD-10-CM

## 2015-04-05 NOTE — Patient Instructions (Signed)
Medication Instructions:  Your physician recommends that you continue on your current medications as directed. Please refer to the Current Medication list given to you today.   Labwork: None   Testing/Procedures: Your physician has requested that you have an echocardiogram. Echocardiography is a painless test that uses sound waves to create images of your heart. It provides your doctor with information about the size and shape of your heart and how well your heart's chambers and valves are working. This procedure takes approximately one hour. There are no restrictions for this procedure.  Your physician has recommended that you wear an event monitor. Event monitors are medical devices that record the heart's electrical activity. Doctors most often [place] these monitors to diagnose arrhythmias. Arrhythmias are problems with the speed or rhythm of the heartbeat. The monitor is a small, portable device. You can wear one while you do your normal daily activities. This is usually used to diagnose what is causing palpitations/syncope (passing out).    Follow-Up: Your physician recommends that you schedule a follow-up appointment in: 2-3 months with Dr. Acie Fredrickson   Any Other Special Instructions Will Be Listed Below (If Applicable).  Cardiac Event Monitoring A cardiac event monitor is a small recording device used to help detect abnormal heart rhythms (arrhythmias). The monitor is used to record heart rhythm when noticeable symptoms such as the following occur:  Fast heartbeats (palpitations), such as heart racing or fluttering.  Dizziness.  Fainting or light-headedness.  Unexplained weakness. The monitor is wired to two electrodes placed on your chest. Electrodes are flat, sticky disks that attach to your skin. The monitor can be worn for up to 30 days. You will wear the monitor at all times, except when bathing.  HOW TO USE YOUR CARDIAC EVENT MONITOR A technician will prepare your chest for the  electrode placement. The technician will show you how to place the electrodes, how to work the monitor, and how to replace the batteries. Take time to practice using the monitor before you leave the office. Make sure you understand how to send the information from the monitor to your health care provider. This requires a telephone with a landline, not a cell phone. You need to:  Wear your monitor at all times, except when you are in water:  Do not get the monitor wet.  Take the monitor off when bathing. Do not swim or use a hot tub with it on.  Keep your skin clean. Do not put body lotion or moisturizer on your chest.  Change the electrodes daily or any time they stop sticking to your skin. You might need to use tape to keep them on.  It is possible that your skin under the electrodes could become irritated. To keep this from happening, try to put the electrodes in slightly different places on your chest. However, they must remain in the area under your left breast and in the upper right section of your chest.  Make sure the monitor is safely clipped to your clothing or in a location close to your body that your health care provider recommends.  Press the button to record when you feel symptoms of heart trouble, such as dizziness, weakness, light-headedness, palpitations, thumping, shortness of breath, unexplained weakness, or a fluttering or racing heart. The monitor is always on and records what happened slightly before you pressed the button, so do not worry about being too late to get good information.  Keep a diary of your activities, such as walking, doing chores,  and taking medicine. It is especially important to note what you were doing when you pushed the button to record your symptoms. This will help your health care provider determine what might be contributing to your symptoms. The information stored in your monitor will be reviewed by your health care provider alongside your diary  entries.  Send the recorded information as recommended by your health care provider. It is important to understand that it will take some time for your health care provider to process the results.  Change the batteries as recommended by your health care provider. SEEK IMMEDIATE MEDICAL CARE IF:   You have chest pain.  You have extreme difficulty breathing or shortness of breath.  You develop a very fast heartbeat that persists.  You develop dizziness that does not go away.  You faint or constantly feel you are about to faint. Document Released: 04/02/2008 Document Revised: 11/08/2013 Document Reviewed: 12/21/2012 [facility] Patient Information 2015 [place], [place]. This information is not intended to replace advice given to you by your health care provider. Make sure you discuss any questions you have with your health care provider. Echocardiogram An echocardiogram, or echocardiography, uses sound waves (ultrasound) to produce an image of your heart. The echocardiogram is simple, painless, obtained within a short period of time, and offers valuable information to your health care provider. The images from an echocardiogram can provide information such as:  Evidence of coronary artery disease (CAD).  Heart size.  Heart muscle function.  Heart valve function.  Aneurysm detection.  Evidence of a past heart attack.  Fluid buildup around the heart.  Heart muscle thickening.  Assess heart valve function. LET [facility] CARE PROVIDER KNOW ABOUT:  Any allergies you have.  All medicines you are taking, including vitamins, herbs, eye drops, creams, and over-the-counter medicines.  Previous problems you or members of your family have had with the use of anesthetics.  Any blood disorders you have.  Previous surgeries you have had.  Medical conditions you have.  Possibility of pregnancy, if this applies. BEFORE THE PROCEDURE  No special preparation is needed. Eat and drink  normally.  PROCEDURE   In order to produce an image of your heart, gel will be applied to your chest and a wand-like tool (transducer) will be moved over your chest. The gel will help transmit the sound waves from the transducer. The sound waves will harmlessly bounce off your heart to allow the heart images to be captured in real-time motion. These images will then be recorded.  You may need an IV to receive a medicine that improves the quality of the pictures. AFTER THE PROCEDURE You may return to your normal schedule including diet, activities, and medicines, unless your health care provider tells you otherwise. Document Released: 06/21/2000 Document Revised: 11/08/2013 Document Reviewed: 03/01/2013 [facility] Patient Information 2015 [place], [place]. This information is not intended to replace advice given to you by your health care provider. Make sure you discuss any questions you have with your health care provider.

## 2015-04-05 NOTE — Progress Notes (Signed)
Cardiology Office Note   Date:  04/05/2015   ID:  Robin Lynn, DOB May 10, 1919, MRN [id number]  PCP:  Thersa Salt, DO  Cardiologist:   Acie Fredrickson Wonda Cheng, MD   Chief Complaint  Patient presents with  . other    C/o syncope. Meds reviewed verbally with pt.   Problem List 1. Syncope 2. Hyperlipidemia 3. Essential Hypertension     History of Present Illness: Robin Lynn is a 81 y.o. female who presents for further evaluation of an episode of syncope. She was admitted to [facility] 03/23/15 . She was seen by Dr. Chancy Milroy in consultation .  Was walking in the grocery store.  Husband was riding in the scooter next to her. Passed out suddenly.  Fell forward.  No warning . Woke up in the ambulance .  Remained very foggy while in the store  Woke up , no post ictal symptoms .    Feeling well now.  No further symptoms. Orthostatics were done here.  No significant drop in BP .  Walks with the assistance of a cane  .  Past Medical History  Diagnosis Date  . HTN (hypertension)   . HLD (hyperlipidemia)   . Urinary incontinence   . Breast CA     Past Surgical History  Procedure Laterality Date  . Appendectomy    . Breast lumpectomy Left     with radiation     Current Outpatient Prescriptions  Medication Sig Dispense Refill  . atorvastatin (LIPITOR) 10 MG tablet Take 10 mg by mouth daily.  3  . losartan-hydrochlorothiazide (HYZAAR) 100-25 MG per tablet Take 1 tablet by mouth daily.  3   No current facility-administered medications for this visit.    Allergies:   Review of patient's allergies indicates no known allergies.    Social History:  The patient  reports that she has never smoked. She has never used smokeless tobacco. She reports that she drinks about 0.6 oz of alcohol per week. She reports that she does not use illicit drugs.   Family History:  The patient's family history includes CAD in her father; Cancer in an other family member; Heart attack in her  father.    ROS:  Please see the history of present illness.    Review of Systems: Constitutional:  denies fever, chills, diaphoresis, appetite change and fatigue.  HEENT: denies photophobia, eye pain, redness, hearing loss, ear pain, congestion, sore throat, rhinorrhea, sneezing, neck pain, neck stiffness and tinnitus.  Respiratory: denies SOB, DOE, cough, chest tightness, and wheezing.  Cardiovascular: denies chest pain, palpitations and leg swelling.  Gastrointestinal: denies nausea, vomiting, abdominal pain, diarrhea, constipation, blood in stool.  Genitourinary: denies dysuria, urgency, frequency, hematuria, flank pain and difficulty urinating.  Musculoskeletal: denies  myalgias, back pain, joint swelling, arthralgias and gait problem.   Skin: denies pallor, rash and wound.  Neurological: denies dizziness, seizures, syncope, weakness, light-headedness, numbness and headaches.   Hematological: denies adenopathy, easy bruising, personal or family bleeding history.  Psychiatric/ Behavioral: denies suicidal ideation, mood changes, confusion, nervousness, sleep disturbance and agitation.       All other systems are reviewed and negative.    PHYSICAL EXAM: VS:  BP 159/69 mmHg  Pulse 67  Ht 5\' 5"  (1.651 m)  Wt 60.102 kg (132 lb 8 oz)  BMI 22.05 kg/m2 , BMI Body mass index is 22.05 kg/(m^2). GEN: Well nourished, well developed, in no acute distress HEENT: normal Neck: no JVD, carotid bruits, or masses Cardiac:  RRR; no murmurs, rubs, or gallops,no edema  Respiratory:  clear to auscultation bilaterally, normal work of breathing GI: soft, nontender, nondistended, + BS MS: no deformity or atrophy Skin: warm and dry, no rash Neuro:  Strength and sensation are intact, walks with the assistance of a cane  Psych: normal   EKG:  EKG is ordered today. The ekg ordered today demonstrates NSR at 66. No ST or T wave changes.    Recent Labs: 03/24/2015: BUN 6; Creatinine, Ser 0.66;  Hemoglobin 13.7; Platelets 230; Potassium 3.7; Sodium 137; TSH 2.555    Lipid Panel No results found for: CHOL, TRIG, HDL, CHOLHDL, VLDL, LDLCALC, LDLDIRECT    Wt Readings from Last 3 Encounters:  04/05/15 60.102 kg (132 lb 8 oz)  03/27/15 60.782 kg (134 lb)  03/23/15 60.737 kg (133 lb 14.4 oz)      Other studies Reviewed: Additional studies/ records that were reviewed today include: . Review of the above records demonstrates:    ASSESSMENT AND PLAN:  1. Syncope: The patient presents with a history of syncope.  Certainly could've been a cardiac etiology. We will place a 30 day event monitor. I would like to get an echocardiogram. Her carotids are 1-2+ and have no bruits. I do not think that a carotid duplex as necessary. Will see her in 2-3 months for follow-up visit.  Current medicines are reviewed at length with the patient today.  The patient does not have concerns regarding medicines.  The following changes have been made:  no change  Labs/ tests ordered today include:  Orders Placed This Encounter  Procedures  . EKG 12-Lead     Disposition:   FU with [place] in 2-3 months      Tychelle Purkey, Wonda Cheng, MD  04/05/2015 1:45 PM    [facility] [place], [place], [place]  [address] Phone: [phone]; Fax: [phone]   [facility]  [address] [place],   [address] [phone]   Fax [phone]

## 2015-04-07 ENCOUNTER — Ambulatory Visit (INDEPENDENT_AMBULATORY_CARE_PROVIDER_SITE_OTHER): Payer: Medicare Other

## 2015-04-07 DIAGNOSIS — R55 Syncope and collapse: Secondary | ICD-10-CM | POA: Diagnosis not present

## 2015-04-10 ENCOUNTER — Telehealth: Payer: Self-pay | Admitting: Family Medicine

## 2015-04-10 ENCOUNTER — Ambulatory Visit: Payer: Medicare Other | Admitting: Family Medicine

## 2015-04-10 NOTE — Telephone Encounter (Signed)
FYI, Pt called stating she is not feeling well she also states she is being taking care of and can not make her appt today @11am . Pt states she will call back to resch appt. Thank You!

## 2015-04-20 ENCOUNTER — Other Ambulatory Visit: Payer: Self-pay

## 2015-04-20 ENCOUNTER — Ambulatory Visit (INDEPENDENT_AMBULATORY_CARE_PROVIDER_SITE_OTHER): Payer: Medicare Other

## 2015-04-20 DIAGNOSIS — R55 Syncope and collapse: Secondary | ICD-10-CM

## 2015-05-09 ENCOUNTER — Ambulatory Visit: Payer: Medicare Other

## 2015-05-31 ENCOUNTER — Ambulatory Visit: Payer: Medicare Other | Admitting: Cardiovascular Disease

## 2015-07-25 ENCOUNTER — Encounter: Payer: Self-pay | Admitting: Cardiovascular Disease

## 2015-07-25 ENCOUNTER — Ambulatory Visit (INDEPENDENT_AMBULATORY_CARE_PROVIDER_SITE_OTHER): Payer: Medicare Other | Admitting: Cardiovascular Disease

## 2015-07-25 VITALS — BP 120/60 | HR 76 | Ht 65.0 in | Wt 132.0 lb

## 2015-07-25 DIAGNOSIS — I10 Essential (primary) hypertension: Secondary | ICD-10-CM | POA: Diagnosis not present

## 2015-07-25 NOTE — Assessment & Plan Note (Signed)
Blood pressure is well controlled on current medications. 

## 2015-07-25 NOTE — Progress Notes (Signed)
HPI   this is a 82 year old female was here today for a follow-up visit regarding syncope. She was admitted to [facility] 03/23/15  After a syncopal episode while she was walking at  [employer]. She had a sudden loss of consciousness with no preceding symptoms.  Workup in the hospital was nonrevealing. Carotid Doppler showed no evidence of obstructive disease.  she was seen by Dr. Acie Fredrickson  In October [place] and an echocardiogram was performed which showed normal LV systolic function with grade 1 diastolic dysfunction , no significant valvular abnormalities a no evidence of pulmonary hypertension. She underwent a 30 day outpatient telemetry which showed only short runs of atrial tachycardia with no evidence of other significant arrhythmia.  She reports no further episodes of syncope or presyncope. She denies any chest pain or shortness of breath. She is independent overall lives with her husband at [facility] [place].  No Known Allergies   Current Outpatient Prescriptions on File Prior to Visit  Medication Sig Dispense Refill  . atorvastatin (LIPITOR) 10 MG tablet Take 10 mg by mouth daily.  3  . losartan-hydrochlorothiazide (HYZAAR) 100-25 MG per tablet Take 1 tablet by mouth daily.  3   No current facility-administered medications on file prior to visit.     Past Medical History  Diagnosis Date  . HTN (hypertension)   . HLD (hyperlipidemia)   . Urinary incontinence   . Breast CA [facility])      Past Surgical History  Procedure Laterality Date  . Appendectomy    . Breast lumpectomy Left     with radiation     Family History  Problem Relation Age of Onset  . Cancer    . CAD Father   . Heart attack Father      Social History   Social History  . Marital Status: Married    Spouse Name: N/A  . Number of Children: N/A  . Years of Education: N/A   Occupational History  . Not on file.   Social History Main Topics  . Smoking status: Never Smoker   . Smokeless  tobacco: Never Used  . Alcohol Use: 0.6 oz/week    1 Glasses of wine per week  . Drug Use: No  . Sexual Activity: Not on file   Other Topics Concern  . Not on file   Social History Narrative      PHYSICAL EXAM   BP 120/60 mmHg  Pulse 76  Ht 5\' 5"  (1.651 m)  Wt 132 lb (59.875 kg)  BMI 21.97 kg/m2 Constitutional: She is oriented to person, place, and time. She appears well-developed and well-nourished. No distress.  HENT: No nasal discharge.  Head: Normocephalic and atraumatic.  Eyes: Pupils are equal and round. No discharge.  Neck: Normal range of motion. Neck supple. No JVD present. No thyromegaly present.  Cardiovascular: Normal rate, regular rhythm, normal heart sounds. Exam reveals no gallop and no friction rub. No murmur heard.  Pulmonary/Chest: Effort normal and breath sounds normal. No stridor. No respiratory distress. She has no wheezes. She has no rales. She exhibits no tenderness.  Abdominal: Soft. Bowel sounds are normal. She exhibits no distension. There is no tenderness. There is no rebound and no guarding.  Musculoskeletal: Normal range of motion. She exhibits no edema and no tenderness.  Neurological: She is alert and oriented to person, place, and time. Coordination normal.  Skin: Skin is warm and dry. No rash noted. She is not diaphoretic. No erythema. No pallor.  Psychiatric: She has a normal mood and affect. Her behavior is normal. Judgment and thought content normal.      ASSESSMENT AND PLAN

## 2015-07-25 NOTE — Patient Instructions (Signed)
Medication Instructions: No changes.   Labwork: None.   Procedures/Testing: None.   Follow-Up: As needed with Dr. Arida.   Any Additional Special Instructions Will Be Listed Below (If Applicable).   

## 2015-07-25 NOTE — Assessment & Plan Note (Signed)
So far there has been no clear explanation for her sudden syncopal episode. Echocardiogram and outpatient telemetry were both nonrevealing. She did have short runs of atrial tachycardia but these should not cause syncope. Fortunately, the patient has not had any recurrent symptoms and thus I think it's reasonable to monitor the patient for now. If she develops recurrent episodes, she might require further neurologic evaluation.

## 2015-12-15 ENCOUNTER — Ambulatory Visit: Payer: Medicare Other | Admitting: Internal Medicine

## 2015-12-15 DIAGNOSIS — Z0289 Encounter for other administrative examinations: Secondary | ICD-10-CM

## 2016-02-14 ENCOUNTER — Encounter: Payer: Self-pay | Admitting: Internal Medicine

## 2016-02-14 ENCOUNTER — Ambulatory Visit (INDEPENDENT_AMBULATORY_CARE_PROVIDER_SITE_OTHER): Payer: Medicare Other | Admitting: Internal Medicine

## 2016-02-14 VITALS — BP 134/70 | HR 61 | Temp 98.3°F | Ht 65.0 in | Wt 133.0 lb

## 2016-02-14 DIAGNOSIS — Z23 Encounter for immunization: Secondary | ICD-10-CM

## 2016-02-14 DIAGNOSIS — F0150 Vascular dementia without behavioral disturbance: Secondary | ICD-10-CM | POA: Insufficient documentation

## 2016-02-14 DIAGNOSIS — E785 Hyperlipidemia, unspecified: Secondary | ICD-10-CM

## 2016-02-14 DIAGNOSIS — R413 Other amnesia: Secondary | ICD-10-CM

## 2016-02-14 DIAGNOSIS — G3184 Mild cognitive impairment, so stated: Secondary | ICD-10-CM | POA: Diagnosis not present

## 2016-02-14 DIAGNOSIS — I10 Essential (primary) hypertension: Secondary | ICD-10-CM

## 2016-02-14 LAB — CBC WITH DIFFERENTIAL/PLATELET
Basophils Absolute: 0 10*3/uL (ref 0.0–0.1)
Basophils Relative: 0.4 % (ref 0.0–3.0)
Eosinophils Absolute: 0.1 10*3/uL (ref 0.0–0.7)
Eosinophils Relative: 0.7 % (ref 0.0–5.0)
HCT: 40.5 % (ref 36.0–46.0)
Hemoglobin: 13.3 g/dL (ref 12.0–15.0)
Lymphocytes Relative: 23.7 % (ref 12.0–46.0)
Lymphs Abs: 1.8 10*3/uL (ref 0.7–4.0)
MCHC: 32.8 g/dL (ref 30.0–36.0)
MCV: 86.4 fl (ref 78.0–100.0)
Monocytes Absolute: 0.7 10*3/uL (ref 0.1–1.0)
Monocytes Relative: 9.3 % (ref 3.0–12.0)
Neutro Abs: 5 10*3/uL (ref 1.4–7.7)
Neutrophils Relative %: 65.9 % (ref 43.0–77.0)
Platelets: 276 10*3/uL (ref 150.0–400.0)
RBC: 4.68 Mil/uL (ref 3.87–5.11)
RDW: 14.8 % (ref 11.5–15.5)
WBC: 7.6 10*3/uL (ref 4.0–10.5)

## 2016-02-14 LAB — VITAMIN B12: Vitamin B-12: 562 pg/mL (ref 211–911)

## 2016-02-14 LAB — COMPREHENSIVE METABOLIC PANEL
ALT: 13 U/L (ref 0–35)
AST: 20 U/L (ref 0–37)
Albumin: 4.4 g/dL (ref 3.5–5.2)
Alkaline Phosphatase: 69 U/L (ref 39–117)
BUN: 9 mg/dL (ref 6–23)
CO2: 31 mEq/L (ref 19–32)
Calcium: 9.5 mg/dL (ref 8.4–10.5)
Chloride: 94 mEq/L — ABNORMAL LOW (ref 96–112)
Creatinine, Ser: 0.67 mg/dL (ref 0.40–1.20)
GFR: 86.92 mL/min (ref >60.00)
Glucose, Bld: 86 mg/dL (ref 70–99)
Potassium: 3.7 mEq/L (ref 3.5–5.1)
Sodium: 130 mEq/L — ABNORMAL LOW (ref 135–145)
Total Bilirubin: 0.8 mg/dL (ref 0.2–1.2)
Total Protein: 6.6 g/dL (ref 6.0–8.3)

## 2016-02-14 LAB — T4, FREE: Free T4: 1.08 ng/dL (ref 0.60–1.60)

## 2016-02-14 NOTE — Progress Notes (Signed)
   Subjective:    Patient ID: Robin Lynn, female    DOB: Mar 14, 1919, 82 y.o.   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       Appetite is okay--not a big eater Weight is stable They do order food regularly from [place]: Positive for hearing loss. Negative for dental problem.        Keeps up with dentist  Eyes:       Mild vision decline--needs repeat exam Left eye slightly cloudy  Respiratory: Positive for cough. Negative for chest tightness and shortness of breath.        Intermittent dry cough--mostly day  Cardiovascular: Negative for chest pain, palpitations and leg swelling.  Gastrointestinal: Negative for abdominal pain, blood in stool and constipation.       No heartburn  Genitourinary: Negative for dysuria and hematuria.       Nocturia x 2  Musculoskeletal: Positive for arthralgias. Negative for back pain.       Mild knee pain only  Skin: Negative for rash.       Skin is thin and dry No suspicious lesions  Allergic/Immunologic: Negative for environmental allergies and immunocompromised state.  Neurological: Negative for dizziness, weakness, light-headedness and headaches.  Hematological: Negative for adenopathy. Does not bruise/bleed easily.  Psychiatric/Behavioral: Negative for dysphoric mood and sleep disturbance. The patient is not nervous/anxious.          Objective:   Physical Exam  Constitutional: She appears well-developed and well-nourished. No distress.  Neck: Normal range of motion. Neck supple. No thyromegaly present.  Cardiovascular: Normal rate, regular rhythm, normal heart sounds and intact distal pulses.  Exam reveals no gallop.   No murmur heard. Pulmonary/Chest: Effort normal and breath sounds normal. No respiratory distress. She has no wheezes. She has no rales.  Abdominal: Soft. There is no tenderness.  Musculoskeletal: She exhibits no edema or tenderness.  Needs some help up and down from table  Lymphadenopathy:    She has no cervical adenopathy.  Neurological:  "August, 82 something" "Doctor's office, "  Didn't know city D-l-r-[id number] 100-93-? Recall 0/3  Skin: No rash noted. No erythema.  Psychiatric: She has a normal mood and affect. Her behavior is normal.          Assessment & Plan:

## 2016-02-14 NOTE — Patient Instructions (Signed)
Please stop the atorvastatin

## 2016-02-14 NOTE — Assessment & Plan Note (Signed)
BP Readings from Last 3 Encounters:  02/14/16 134/70  07/25/15 120/60  04/05/15 (!) 159/69   Good control No dizziness now---would reconsider Rx if any symptoms Check labs

## 2016-02-14 NOTE — Assessment & Plan Note (Signed)
No known vascular disease but probably has microvascular CNS disease At her age, will try without the statin. Even if vascular dementia, I would like to be sure it isn't causing her cognitive issues and not sure appropriate at 89

## 2016-02-14 NOTE — Addendum Note (Signed)
Addended by: Pilar Grammes on: 02/14/2016 04:52 PM   Modules accepted: Orders

## 2016-02-14 NOTE — Assessment & Plan Note (Signed)
Head CT negative for mass or NPH recently Will just check labs--likely early dementia

## 2016-02-14 NOTE — Progress Notes (Signed)
Pre visit review using our clinic review tool, if applicable. No additional management support is needed unless otherwise documented below in the visit note. 

## 2016-02-14 NOTE — Assessment & Plan Note (Signed)
Probably early vascular dementia Doing well functionally---has accepted appropriate help (no driving, someone shops for her, etc)

## 2016-07-17 ENCOUNTER — Emergency Department: Payer: Medicare Other

## 2016-07-17 ENCOUNTER — Encounter: Payer: Self-pay | Admitting: Emergency Medicine

## 2016-07-17 ENCOUNTER — Emergency Department
Admission: EM | Admit: 2016-07-17 | Discharge: 2016-07-17 | Disposition: A | Discharge status: Home / Self Care | Payer: Medicare Other | Attending: Emergency Medicine | Admitting: Emergency Medicine

## 2016-07-17 DIAGNOSIS — W19XXXA Unspecified fall, initial encounter: Secondary | ICD-10-CM | POA: Diagnosis not present

## 2016-07-17 DIAGNOSIS — Y939 Activity, unspecified: Secondary | ICD-10-CM | POA: Insufficient documentation

## 2016-07-17 DIAGNOSIS — Z853 Personal history of malignant neoplasm of breast: Secondary | ICD-10-CM | POA: Diagnosis not present

## 2016-07-17 DIAGNOSIS — R4182 Altered mental status, unspecified: Secondary | ICD-10-CM | POA: Insufficient documentation

## 2016-07-17 DIAGNOSIS — I10 Essential (primary) hypertension: Secondary | ICD-10-CM | POA: Diagnosis not present

## 2016-07-17 DIAGNOSIS — Y999 Unspecified external cause status: Secondary | ICD-10-CM | POA: Diagnosis not present

## 2016-07-17 DIAGNOSIS — Y929 Unspecified place or not applicable: Secondary | ICD-10-CM | POA: Insufficient documentation

## 2016-07-17 DIAGNOSIS — R296 Repeated falls: Secondary | ICD-10-CM

## 2016-07-17 HISTORY — DX: Malignant (primary) neoplasm, unspecified: C80.1

## 2016-07-17 LAB — TROPONIN I: Troponin I: <0.03 ng/mL (ref <0.03)

## 2016-07-17 LAB — URINALYSIS, COMPLETE (UACMP) WITH MICROSCOPIC
Bacteria, UA: NONE SEEN
Bilirubin Urine: NEGATIVE
Glucose, UA: NEGATIVE mg/dL
Hgb urine dipstick: NEGATIVE
Ketones, ur: NEGATIVE mg/dL
Nitrite: NEGATIVE
Protein, ur: NEGATIVE mg/dL
Specific Gravity, Urine: 1.012 (ref 1.005–1.030)
pH: 8 (ref 5.0–8.0)

## 2016-07-17 LAB — BASIC METABOLIC PANEL
Anion gap: 7 (ref 5–15)
BUN: 13 mg/dL (ref 6–20)
CO2: 28 mmol/L (ref 22–32)
Calcium: 9.2 mg/dL (ref 8.9–10.3)
Chloride: 100 mmol/L — ABNORMAL LOW (ref 101–111)
Creatinine, Ser: 0.6 mg/dL (ref 0.44–1.00)
GFR calc Af Amer: >60 mL/min (ref >60)
GFR calc non Af Amer: >60 mL/min (ref >60)
Glucose, Bld: 80 mg/dL (ref 65–99)
Potassium: 4 mmol/L (ref 3.5–5.1)
Sodium: 135 mmol/L (ref 135–145)

## 2016-07-17 LAB — CBC
HCT: 41.1 % (ref 35.0–47.0)
Hemoglobin: 13.5 g/dL (ref 12.0–16.0)
MCH: 28.5 pg (ref 26.0–34.0)
MCHC: 32.9 g/dL (ref 32.0–36.0)
MCV: 86.7 fL (ref 80.0–100.0)
Platelets: 263 10*3/uL (ref 150–440)
RBC: 4.75 MIL/uL (ref 3.80–5.20)
RDW: 14.4 % (ref 11.5–14.5)
WBC: 6.7 10*3/uL (ref 3.6–11.0)

## 2016-07-17 MED ORDER — IOPAMIDOL (ISOVUE-370) INJECTION 76%
75.0000 mL | Freq: Once | INTRAVENOUS | Status: AC | PRN
  Administered 2016-07-17: 75 mL via INTRAVENOUS

## 2016-07-17 NOTE — ED Provider Notes (Signed)
[facility] Emergency Department Provider Note ____________________________________________   I have reviewed the triage vital signs and the triage nursing note.  HISTORY  Chief Complaint Altered Mental Status   Historian Patient and her husband Friend  HPI Robin Lynn is a 83 y.o. female states she does not remember what happened, but that she thinks she reached over to get the newspaper on the ground and fell. Her husband states that they had woke up normally around 7 AM and onto the kitchen together and that he heard her call her that she had fallen and he thinks she probably just lost her balance and leaning over to grab the newspaper. However he states that she does not typically have a problem with falls or feeling off-balance.  No reported facial droop or slurred speech. She does not report any weakness or numbness. She does not state that she has a headache or feel dizzy at this point.  Denies traumatic injury.    Past Medical History:  Diagnosis Date  . Cancer ([place])   . History of breast cancer    left  . HLD (hyperlipidemia)   . HTN (hypertension)   . Urinary incontinence     Patient Active Problem List   Diagnosis Date Noted  . MCI (mild cognitive impairment) with memory loss 02/14/2016  . Memory loss 02/14/2016  . HTN (hypertension) 03/23/2015  . HLD (hyperlipidemia) 03/23/2015    Past Surgical History:  Procedure Laterality Date  . APPENDECTOMY    . BREAST LUMPECTOMY Left    with radiation    Prior to Admission medications   Medication Sig Start Date End Date Taking? Authorizing Provider  losartan-hydrochlorothiazide (HYZAAR) 100-25 MG per tablet Take 1 tablet by mouth daily. 02/17/15   Historical Provider, MD    No Known Allergies  Family History  Problem Relation Age of Onset  . CAD Father   . Heart attack Father   . Cancer    . CAD Sister   . Diabetes Neg Hx     Social History Social History  Substance Use  Topics  . Smoking status: Never Smoker  . Smokeless tobacco: Never Used  . Alcohol use 0.6 oz/week    1 Glasses of wine per week    Review of Systems  Constitutional: Negative for fever. Eyes: Negative for visual changes. ENT: Negative for sore throat. Cardiovascular: Negative for chest pain. Respiratory: Negative for shortness of breath. Gastrointestinal: Negative for abdominal pain, vomiting and diarrhea. Genitourinary: Negative for dysuria. Musculoskeletal: Negative for back pain. Skin: Negative for rash. Neurological: Negative for headache. 10 point Review of Systems otherwise negative ____________________________________________   PHYSICAL EXAM:  VITAL SIGNS: ED Triage Vitals [07/17/16 1122]  Enc Vitals Group     BP (!) 139/56     Pulse Rate 68     Resp 20     Temp 98 F (36.7 C)     Temp Source Oral     SpO2 100 %     Weight 133 lb (60.3 kg)     Height      Head Circumference      Peak Flow      Pain Score 8     Pain Loc      Pain Edu?      Excl. in [place]?      Constitutional: Alert and Cooperative, poor historian. Well appearing and in no distress. HEENT   Head: Normocephalic and atraumatic.      Eyes: Conjunctivae are normal.  PERRL. Normal extraocular movements.      Ears:         Nose: No congestion/rhinnorhea.   Mouth/Throat: Mucous membranes are moist.   Neck: No stridor. Cardiovascular/Chest: Normal rate, regular rhythm.  No murmurs, rubs, or gallops. Respiratory: Normal respiratory effort without tachypnea nor retractions. Breath sounds are clear and equal bilaterally. No wheezes/rales/rhonchi. Gastrointestinal: Soft. No distention, no guarding, no rebound. Nontender.    Genitourinary/rectal:Deferred Musculoskeletal: Nontender with normal range of motion in all extremities. No joint effusions.  No lower extremity tenderness.  No edema. Neurologic:  No facial droop. Chem nurse 2 through 10 intact. Normal speech and language. No gross or  focal neurologic deficits are appreciated. Skin:  Skin is warm, dry and intact. No rash noted. Psychiatric: Mood and affect are normal. Speech and behavior are normal. Patient exhibits appropriate insight and judgment.   ____________________________________________  LABS (pertinent positives/negatives)  Labs Reviewed  BASIC METABOLIC PANEL - Abnormal; Notable for the following:       Result Value   Chloride 100 (*)    All other components within normal limits  URINALYSIS, COMPLETE (UACMP) WITH MICROSCOPIC - Abnormal; Notable for the following:    Color, Urine YELLOW (*)    APPearance HAZY (*)    Leukocytes, UA TRACE (*)    Squamous Epithelial / LPF 0-5 (*)    All other components within normal limits  CBC  TROPONIN I    ____________________________________________    EKG I, Lisa Roca, MD, the attending physician have personally viewed and interpreted all ECGs.  66 bpm. normal sinus rhythm. Respiratory normal axis. Normal ST and T-wave ____________________________________________  RADIOLOGY All Xrays were viewed by me. Imaging interpreted by Radiologist.  CT head without contrast:  IMPRESSION: 1. Stable changes of atrophy and small vessel ischemic change with and old right basal ganglial lacunar infarct present. 2. Somewhat higher attenuation of the left middle cerebral artery medially may be due to slice thickness and angulation but an acute left MCA infarct cannot be excluded. Consider MRI if necessary.  MRI brain without contrast:  IMPRESSION: 1. No acute intracranial abnormality. 2. Moderate chronic microvascular ischemic changes and parenchymal volume loss of the brain.  CTA head and neck:  IMPRESSION: Less than 25% diameter stenosis proximal right internal carotid artery 50% diameter proximal external carotid artery on the right  50% diameter stenosis proximal left internal carotid artery  Mild stenosis origin of left vertebral artery and moderate to  severe stenosis origin left vertebral artery  Moderate stenosis in the cavernous carotid bilaterally due to heavily calcified plaque. No other significant intracranial stenosis. __________________________________________  PROCEDURES  Procedure(s) performed: None  Critical Care performed: None  ____________________________________________   ED COURSE / ASSESSMENT AND PLAN  Pertinent labs & imaging results that were available during my care of the patient were reviewed by me and considered in my medical decision making (see chart for details).   Ms. Dirkse came in for evaluation after fall.  CT was obtained due to the unwitnessed fall, and there is no evidence of trauma, but there is questionable possible acute CVA.  Patient and husband both think that she just lost her balance due to leaning forward and the fact that she is 83 years old. They're pretty adamant about that. In any case, she is not having any recent medical illness or symptoms, and her medical evaluation is reassuring.  On exam, she does not have a focal neurologic deficit, and I'm not convinced that her symptoms match with  the possible acute infarct location of the left MCA.   She is not a TPA candidate due to improved/resolved symptoms.  I will however go ahead and obtain an MRI for further evaluation.  I had spoken with the patient and family about hospital observation versus MRI here in the emergency department if that is negative for acute stroke, they would like to go home.  CT angiogram showed no findings that need emergency intervention.  MRI of the brain does not show an acute stroke or other acute abnormality. I discussed this with the family and they're comfortable going home.     CONSULTATIONS:  None   Patient / Family / Caregiver informed of clinical course, medical decision-making process, and agree with plan.   I discussed return precautions, follow-up instructions, and discharge instructions with  patient and/or family.   ___________________________________________   FINAL CLINICAL IMPRESSION(S) / ED DIAGNOSES   Final diagnoses:  Unwitnessed fall              Note: This dictation was prepared with Dragon dictation. Any transcriptional errors that result from this process are unintentional    Lisa Roca, MD 07/17/16 1652

## 2016-07-17 NOTE — ED Triage Notes (Signed)
Pt to ed with c/o fall this am.  Pt states she was trying to get paper from outside but never made it out of the house because she felt weak and dizzy and then "passed out"  Pt c/o headache.  Pt alert but mildly confused.  Pt unsure of day of the week, unsure of year.

## 2016-07-17 NOTE — ED Notes (Signed)
Patient transported to CT 

## 2016-07-17 NOTE — Discharge Instructions (Signed)
You were evaluated after falling forward, no serious injury is suspected. The rest of your exam and evaluation is reassuring for no emergency findings such as stroke Please follow-up with your primary doctor.  Return to the emergency for any worsening symptoms including any confusion or altered mental status, dizziness or passing out, chest pain, palpitations, weakness, numbness, or any other symptoms concerning to you.

## 2016-07-24 ENCOUNTER — Ambulatory Visit: Payer: Medicare Other | Admitting: Internal Medicine

## 2016-07-29 ENCOUNTER — Ambulatory Visit (INDEPENDENT_AMBULATORY_CARE_PROVIDER_SITE_OTHER): Payer: Medicare Other | Admitting: Internal Medicine

## 2016-07-29 ENCOUNTER — Encounter: Payer: Self-pay | Admitting: Internal Medicine

## 2016-07-29 VITALS — BP 112/68 | HR 73 | Temp 98.2°F | Wt 135.0 lb

## 2016-07-29 DIAGNOSIS — I10 Essential (primary) hypertension: Secondary | ICD-10-CM

## 2016-07-29 DIAGNOSIS — F0150 Vascular dementia without behavioral disturbance: Secondary | ICD-10-CM

## 2016-07-29 DIAGNOSIS — E785 Hyperlipidemia, unspecified: Secondary | ICD-10-CM

## 2016-07-29 DIAGNOSIS — R55 Syncope and collapse: Secondary | ICD-10-CM | POA: Diagnosis not present

## 2016-07-29 MED ORDER — LOSARTAN POTASSIUM-HCTZ 50-12.5 MG PO TABS: 1.0000 | ORAL_TABLET | Freq: Every day | ORAL | 3 refills | Status: DC

## 2016-07-29 NOTE — Assessment & Plan Note (Signed)
Off the statin

## 2016-07-29 NOTE — Progress Notes (Signed)
   Subjective:    Patient ID: Robin Lynn, female    DOB: [birth year], 83 y.o.   MRN: [id number]  HPI Current Outpatient Prescriptions on File Prior to Visit  Medication Sig Dispense Refill  . losartan-hydrochlorothiazide (HYZAAR) 100-25 MG per tablet Take 1 tablet by mouth daily.  3   No current facility-administered medications on file prior to visit.     No Known Allergies  Past Medical History:  Diagnosis Date  . Cancer ([place])   . History of breast cancer    left  . HLD (hyperlipidemia)   . HTN (hypertension)   . Urinary incontinence     Past Surgical History:  Procedure Laterality Date  . APPENDECTOMY    . BREAST LUMPECTOMY Left    with radiation    Family History  Problem Relation Age of Onset  . CAD Father   . Heart attack Father   . Cancer    . CAD Sister   . Diabetes Neg Hx     Social History   Social History  . Marital status: Married    Spouse name: N/A  . Number of children: 1  . Years of education: N/A   Occupational History  . Vocalist-- opera and showtunes     retired   Social History Main Topics  . Smoking status: Never Smoker  . Smokeless tobacco: Never Used  . Alcohol use 0.6 oz/week    1 Glasses of wine per week  . Drug use: No  . Sexual activity: Not on file   Other Topics Concern  . Not on file   Social History Narrative   2nd marriage---currently married ~55 years   1 daughter   1 son who died      Has living will   Husband is health care POA   Discussed DNR--she requests (done 02/14/16)   No tube feeds if cognitively unaware      Review of Systems     Objective:   Physical Exam        Assessment & Plan:

## 2016-07-29 NOTE — Progress Notes (Signed)
   Subjective:    Patient ID: Robin Lynn, female    DOB: Sep 10, 1918, 83 y.o.   MRN: [id number]  HPI Here for ER follow up With husband and aide who transported them  She doesn't remember the fall at all Reviewed the ER notes No signs of stroke  No focal weakness No facial droop or aphasia No chest pain No SOB No dizziness  She is still cooking Not sure how well they are eating They do order from the [employer] at times  Has aides twice a week now  Current Outpatient Prescriptions on File Prior to Visit  Medication Sig Dispense Refill  . losartan-hydrochlorothiazide (HYZAAR) 100-25 MG per tablet Take 1 tablet by mouth daily.  3   No current facility-administered medications on file prior to visit.     No Known Allergies  Past Medical History:  Diagnosis Date  . Cancer ([place])   . History of breast cancer    left  . HLD (hyperlipidemia)   . HTN (hypertension)   . Urinary incontinence     Past Surgical History:  Procedure Laterality Date  . APPENDECTOMY    . BREAST LUMPECTOMY Left    with radiation    Family History  Problem Relation Age of Onset  . CAD Father   . Heart attack Father   . Cancer    . CAD Sister   . Diabetes Neg Hx     Social History   Social History  . Marital status: Married    Spouse name: N/A  . Number of children: 1  . Years of education: N/A   Occupational History  . Vocalist-- opera and showtunes     retired   Social History Main Topics  . Smoking status: Never Smoker  . Smokeless tobacco: Never Used  . Alcohol use 0.6 oz/week    1 Glasses of wine per week  . Drug use: No  . Sexual activity: Not on file   Other Topics Concern  . Not on file   Social History Narrative   2nd marriage---currently married ~55 years   1 daughter   1 son who died      Has living will   Husband is health care POA   Discussed DNR--she requests (done 02/14/16)   No tube feeds if cognitively unaware   Review of Systems Stays  active Appetite is good Weight stable     Objective:   Physical Exam  Constitutional: She appears well-nourished. No distress.  Neck: Normal range of motion. Neck supple.  Cardiovascular: Normal rate, regular rhythm and normal heart sounds.  Exam reveals no gallop.   No murmur heard. Pulmonary/Chest: Effort normal and breath sounds normal. No respiratory distress. She has no wheezes. She has no rales.  Lymphadenopathy:    She has no cervical adenopathy.  Psychiatric: She has a normal mood and affect. Her behavior is normal.          Assessment & Plan:

## 2016-07-29 NOTE — Assessment & Plan Note (Signed)
Has functioned well but I recommend moving to [place] Their daughter is coming into town to tour it with them

## 2016-07-29 NOTE — Assessment & Plan Note (Signed)
Unclear history with fall I am concerned with her BP being low today that it could have been an orthostatic spell Reviewed ER records

## 2016-07-29 NOTE — Assessment & Plan Note (Signed)
BP Readings from Last 3 Encounters:  07/29/16 112/68  07/17/16 (!) 149/59  02/14/16 134/70   Will cut med in half

## 2016-07-29 NOTE — Patient Instructions (Signed)
Please cut your current losartan/HCTZ in half and only take 1/2 daily. I have sent a new prescription for the lower dose. I recommend you strongly consider a move to assisted living.

## 2016-07-29 NOTE — Progress Notes (Signed)
Pre visit review using our clinic review tool, if applicable. No additional management support is needed unless otherwise documented below in the visit note. 

## 2016-08-21 ENCOUNTER — Encounter: Payer: Self-pay | Admitting: Internal Medicine

## 2016-08-21 ENCOUNTER — Ambulatory Visit (INDEPENDENT_AMBULATORY_CARE_PROVIDER_SITE_OTHER): Payer: Medicare Other | Admitting: Internal Medicine

## 2016-08-21 VITALS — BP 122/70 | HR 72 | Temp 98.4°F | Ht 65.0 in | Wt 135.0 lb

## 2016-08-21 DIAGNOSIS — R55 Syncope and collapse: Secondary | ICD-10-CM

## 2016-08-21 DIAGNOSIS — E785 Hyperlipidemia, unspecified: Secondary | ICD-10-CM

## 2016-08-21 DIAGNOSIS — Z7189 Other specified counseling: Secondary | ICD-10-CM | POA: Diagnosis not present

## 2016-08-21 DIAGNOSIS — Z0000 Encounter for general adult medical examination without abnormal findings: Secondary | ICD-10-CM | POA: Insufficient documentation

## 2016-08-21 DIAGNOSIS — I10 Essential (primary) hypertension: Secondary | ICD-10-CM

## 2016-08-21 DIAGNOSIS — F0150 Vascular dementia without behavioral disturbance: Secondary | ICD-10-CM | POA: Diagnosis not present

## 2016-08-21 NOTE — Assessment & Plan Note (Signed)
BP Readings from Last 3 Encounters:  08/21/16 122/70  07/29/16 112/68  07/17/16 (!) 149/59   BP still on low side despite decreasing the med Will try off and have RN at [facility] check her BP weekly for next month

## 2016-08-21 NOTE — Assessment & Plan Note (Signed)
Has DNR See social history 

## 2016-08-21 NOTE — Assessment & Plan Note (Signed)
Would not treat at her age

## 2016-08-21 NOTE — Assessment & Plan Note (Signed)
No recurrence I suspect this is hypoperfusion

## 2016-08-21 NOTE — Progress Notes (Signed)
Subjective:    Patient ID: Robin Lynn, female    DOB: April 22, 1919, 83 y.o.   MRN: [id number]  HPI Here for Medicare wellness and follow up of chronic medical conditions With husband and Jenetta Downer (aide from [facility]) Reviewed form and advanced directives Reviewed other doctors--not really anyone Rare alcohol No tobacco Doesn't really exercise other than some regular walking Vision is okay Hearing is not great--asks folks to repeat themselves She does the instrumental ADLs--but not clear how well Does all her ADLs Generally continent--wears pad for occasional urinary incontinence 1 fall with syncope--no injury No depression or anhedonia  No falls or syncope since the last visit Now only taking 1/2 of the BP pill daily Hasn't had her BP checked at all No reports of dizziness---but she doesn't really remember  Ongoing dementia Someone shops for them once a week and has aides for doctor's appts One other aide once a week?? They are not clear about this Get food from the [place] at times (not sure how often) They did look at Assisted Living-- "I think that is a good idea" but she is reluctant to move  Current Outpatient Prescriptions on File Prior to Visit  Medication Sig Dispense Refill  . losartan-hydrochlorothiazide (HYZAAR) 50-12.5 MG tablet Take 1 tablet by mouth daily. 90 tablet 3   No current facility-administered medications on file prior to visit.     No Known Allergies  Past Medical History:  Diagnosis Date  . Cancer ([place])   . History of breast cancer    left  . HLD (hyperlipidemia)   . HTN (hypertension)   . Urinary incontinence     Past Surgical History:  Procedure Laterality Date  . APPENDECTOMY    . BREAST LUMPECTOMY Left    with radiation    Family History  Problem Relation Age of Onset  . CAD Father   . Heart attack Father   . Cancer    . CAD Sister   . Diabetes Neg Hx     Social History   Social History  . Marital status:  Married    Spouse name: N/A  . Number of children: 1  . Years of education: N/A   Occupational History  . Vocalist-- opera and showtunes     retired   Social History Main Topics  . Smoking status: Never Smoker  . Smokeless tobacco: Never Used  . Alcohol use 0.6 oz/week    1 Glasses of wine per week  . Drug use: No  . Sexual activity: Not on file   Other Topics Concern  . Not on file   Social History Narrative   2nd marriage---currently married ~55 years   1 daughter   1 son who died      Has living will   Husband is health care POA   Discussed DNR--she requests (done 02/14/16)   No tube feeds if cognitively unaware   Review of Systems Appetite is fine Weight stable Sleeps well Teeth are okay--- no recent dental appointments Wears seat belt. She doesn't drive Bowels are fine. No blood Walks bent over some---mild back pain. No sig rash or suspicious skin lesions. Just some dry skin. No heartburn or swallowing problems    Objective:   Physical Exam  Constitutional: She appears well-nourished. No distress.  HENT:  Mouth/Throat: Oropharynx is clear and moist. No oropharyngeal exudate.  Neck: Normal range of motion. No thyromegaly present.  Cardiovascular: Normal rate, regular rhythm, normal heart sounds and intact distal  pulses.  Exam reveals no gallop.   No murmur heard. Pulmonary/Chest: Effort normal and breath sounds normal. No respiratory distress. She has no wheezes. She has no rales.  Abdominal: Soft. There is no tenderness.  Musculoskeletal: She exhibits no edema or tenderness.  Lymphadenopathy:    She has no cervical adenopathy.  Neurological:  Ongoing confusion but engages well--very talkative  Skin: No rash noted. No erythema.  Psychiatric: She has a normal mood and affect. Her behavior is normal.          Assessment & Plan:

## 2016-08-21 NOTE — Progress Notes (Signed)
Pre visit review using our clinic review tool, if applicable. No additional management support is needed unless otherwise documented below in the visit note. 

## 2016-08-21 NOTE — Assessment & Plan Note (Signed)
I have personally reviewed the Medicare Annual Wellness questionnaire and have noted 1. The patient's medical and social history 2. Their use of alcohol, tobacco or illicit drugs 3. Their current medications and supplements 4. The patient's functional ability including ADL's, fall risks, home safety risks and hearing or visual             impairment. 5. Diet and physical activities 6. Evidence for depression or mood disorders  The patients weight, height, BMI and visual acuity have been recorded in the chart I have made referrals, counseling and provided education to the patient based review of the above and I have provided the pt with a written personalized care plan for preventive services.  I have provided you with a copy of your personalized plan for preventive services. Please take the time to review along with your updated medication list.  Will give pneumovax later this year (too soon) No cancer screening due to age Valentino Saxon to stay active

## 2016-08-21 NOTE — Patient Instructions (Signed)
Please stop the medication completely. Have the nurse at [facility] check your blood pressure weekly over the next month to be sure it is okay off the medication.

## 2016-08-21 NOTE — Assessment & Plan Note (Signed)
Functions okay but slipping They are first on the list for AL--I urged them to move when a room is available

## 2016-10-28 ENCOUNTER — Encounter: Payer: Self-pay | Admitting: Internal Medicine

## 2016-10-28 ENCOUNTER — Ambulatory Visit (INDEPENDENT_AMBULATORY_CARE_PROVIDER_SITE_OTHER): Payer: Medicare Other | Admitting: Internal Medicine

## 2016-10-28 VITALS — BP 118/66 | HR 72 | Temp 97.9°F | Wt 139.2 lb

## 2016-10-28 DIAGNOSIS — F0150 Vascular dementia without behavioral disturbance: Secondary | ICD-10-CM

## 2016-10-28 DIAGNOSIS — I10 Essential (primary) hypertension: Secondary | ICD-10-CM

## 2016-10-28 NOTE — Progress Notes (Signed)
Pre visit review using our clinic review tool, if applicable. No additional management support is needed unless otherwise documented below in the visit note. 

## 2016-10-28 NOTE — Assessment & Plan Note (Signed)
Mild but I feel that would be well served to move to [place] the Education officer, museum at [facility] and spoke to them

## 2016-10-28 NOTE — Assessment & Plan Note (Signed)
BP Readings from Last 3 Encounters:  10/28/16 118/66  08/21/16 122/70  07/29/16 112/68   BP is fine off the medication No dizziness

## 2016-10-28 NOTE — Progress Notes (Signed)
   Subjective:    Patient ID: Robin Lynn, female    DOB: 13-May-1919, 83 y.o.   MRN: [id number]  HPI Here with husband and aide from [facility] for follow up of chronic problems They pulled cord to call nurse this AM--they don't remember why Some concern from RN of UTI--but no fever, dysuria, hematuria  She notices "everything is slowing down" Has some one to shop for them--- meals from [employer]  No dizziness or syncope No problems off the BP med No chest pain Notices some DOE--- stamina is not the same  [facility] with cane Independent with ADLs  They have thought about AL--but not ready to make decision Children putting pressure on them to move  No current outpatient prescriptions on file prior to visit.   No current facility-administered medications on file prior to visit.     No Known Allergies  Past Medical History:  Diagnosis Date  . Cancer ([place])   . History of breast cancer    left  . HLD (hyperlipidemia)   . HTN (hypertension)   . Urinary incontinence     Past Surgical History:  Procedure Laterality Date  . APPENDECTOMY    . BREAST LUMPECTOMY Left    with radiation    Family History  Problem Relation Age of Onset  . CAD Father   . Heart attack Father   . Cancer    . CAD Sister   . Diabetes Neg Hx     Social History   Social History  . Marital status: Married    Spouse name: N/A  . Number of children: 1  . Years of education: N/A   Occupational History  . Vocalist-- opera and showtunes     retired   Social History Main Topics  . Smoking status: Never Smoker  . Smokeless tobacco: Never Used  . Alcohol use 0.6 oz/week    1 Glasses of wine per week  . Drug use: No  . Sexual activity: Not on file   Other Topics Concern  . Not on file   Social History Narrative   2nd marriage---currently married ~55 years   1 daughter   1 son who died      Has living will   Husband is health care POA   Discussed DNR--she requests (done 02/14/16)   No tube feeds if cognitively unaware   Review of Systems Appetite is okay Weight is up a little    Objective:   Physical Exam  Constitutional: She appears well-nourished. No distress.  Neck: No thyromegaly present.  Cardiovascular: Normal rate, regular rhythm and normal heart sounds.  Exam reveals no gallop.   No murmur heard. Pulmonary/Chest: Effort normal and breath sounds normal. No respiratory distress. She has no wheezes. She has no rales.  Musculoskeletal: She exhibits no edema.  Lymphadenopathy:    She has no cervical adenopathy.  Psychiatric: She has a normal mood and affect. Her behavior is normal.          Assessment & Plan:

## 2016-12-16 ENCOUNTER — Ambulatory Visit (INDEPENDENT_AMBULATORY_CARE_PROVIDER_SITE_OTHER): Payer: Medicare Other | Admitting: Podiatry

## 2016-12-16 DIAGNOSIS — M79676 Pain in unspecified toe(s): Secondary | ICD-10-CM

## 2016-12-16 DIAGNOSIS — B351 Tinea unguium: Secondary | ICD-10-CM

## 2016-12-16 NOTE — Progress Notes (Signed)
Subjective:     Patient ID: Robin Lynn, female   DOB: [birth year], 83 y.o.   MRN: [id number]  HPI this patient presents the office with chief complaint of long thick painful nails. Patient has been unable to trim her nails herself. She states the nails have grown thick and long and causing pain and discomfort. She presents the office today for preventative foot care services   Review of Systems     Objective:   Physical Exam GENERAL APPEARANCE: Alert, conversant. Appropriately groomed. No acute distress.  VASCULAR: Pedal pulses are  palpable at  [facility] and PT bilateral.  Capillary refill time is immediate to all digits,  Normal temperature gradient.  Digital hair growth is present bilateral  NEUROLOGIC: sensation is normal to 5.07 monofilament at 5/5 sites bilateral.  Light touch is intact bilateral, Muscle strength normal.  MUSCULOSKELETAL: acceptable muscle strength, tone and stability bilateral.  Intrinsic muscluature intact bilateral.  Rectus appearance of foot and digits noted bilateral.  NAILS  Thick disfigured discolored nails both feet. DERMATOLOGIC: skin color, texture, and turgor are within normal limits.  No preulcerative lesions or ulcers  are seen, no interdigital maceration noted.  No open lesions present.  . No drainage noted.      Assessment:    Onychomycosis  B/L    Plan:     IE  Debride nails  RTC 3 months.   Gardiner Barefoot DPM

## 2016-12-20 ENCOUNTER — Telehealth: Payer: Self-pay

## 2016-12-20 NOTE — Telephone Encounter (Signed)
Jim at [facility] said pts caregiver request prepacked med system; pt is confused about what medication is to be taken. At last visit 10/28/16 cannot see where pt is on any med. Please advise. Jim request cb ASAP so can get system set up.

## 2016-12-20 NOTE — Telephone Encounter (Signed)
Spoke with pharmacist  

## 2016-12-20 NOTE — Telephone Encounter (Signed)
Only 1 med--pass that on

## 2016-12-23 ENCOUNTER — Telehealth: Payer: Self-pay

## 2016-12-23 NOTE — Telephone Encounter (Signed)
Charise Education officer, museum at [facility] left v/m requesting only pharmacy to be listed on pts chart is [facility]; done.

## 2017-01-23 IMAGING — CR DG HUMERUS 2V *R*
1 series · 3 of 3 positions shown · non-contrast
Comparison: None.

CLINICAL DATA: Fall today with right arm pain upper arm to elbow.

EXAM:
RIGHT HUMERUS - 2+ VIEW

[Series 1: dg humerus right · 0.14mm/px · 3 of 3 slices shown]
[im 1/3]
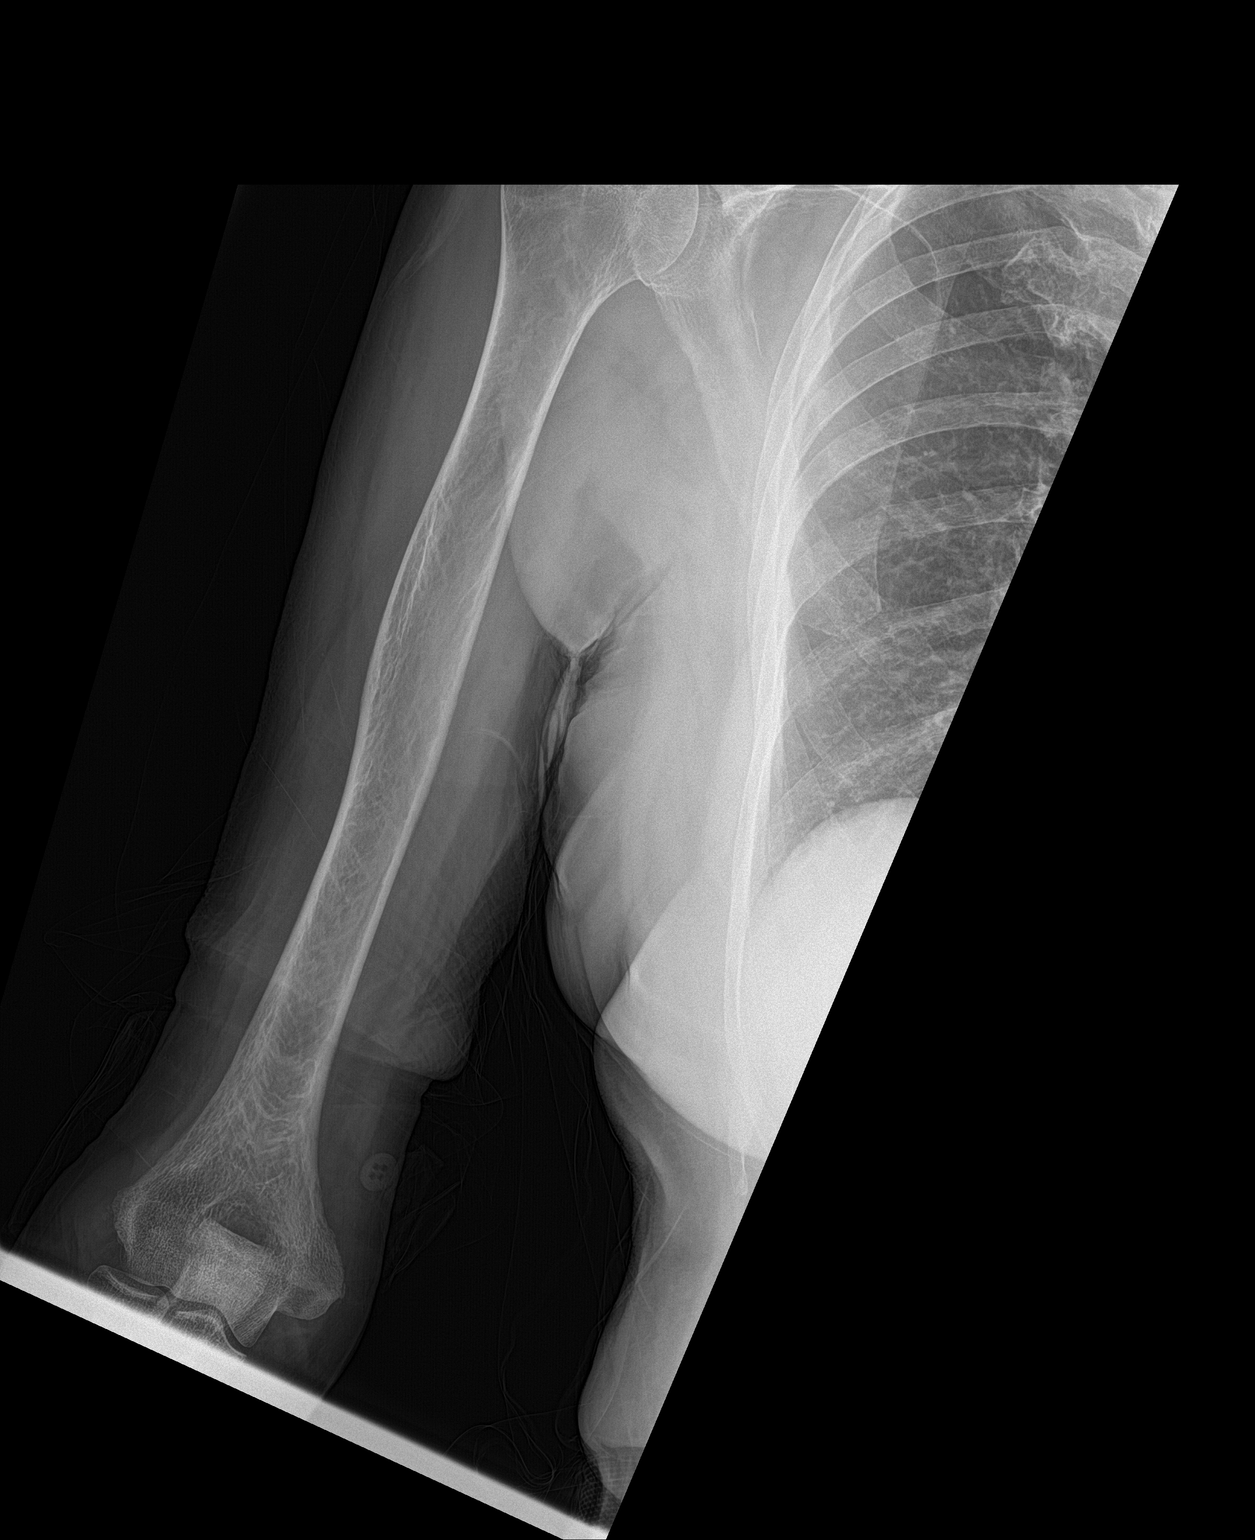
[im 2/3]
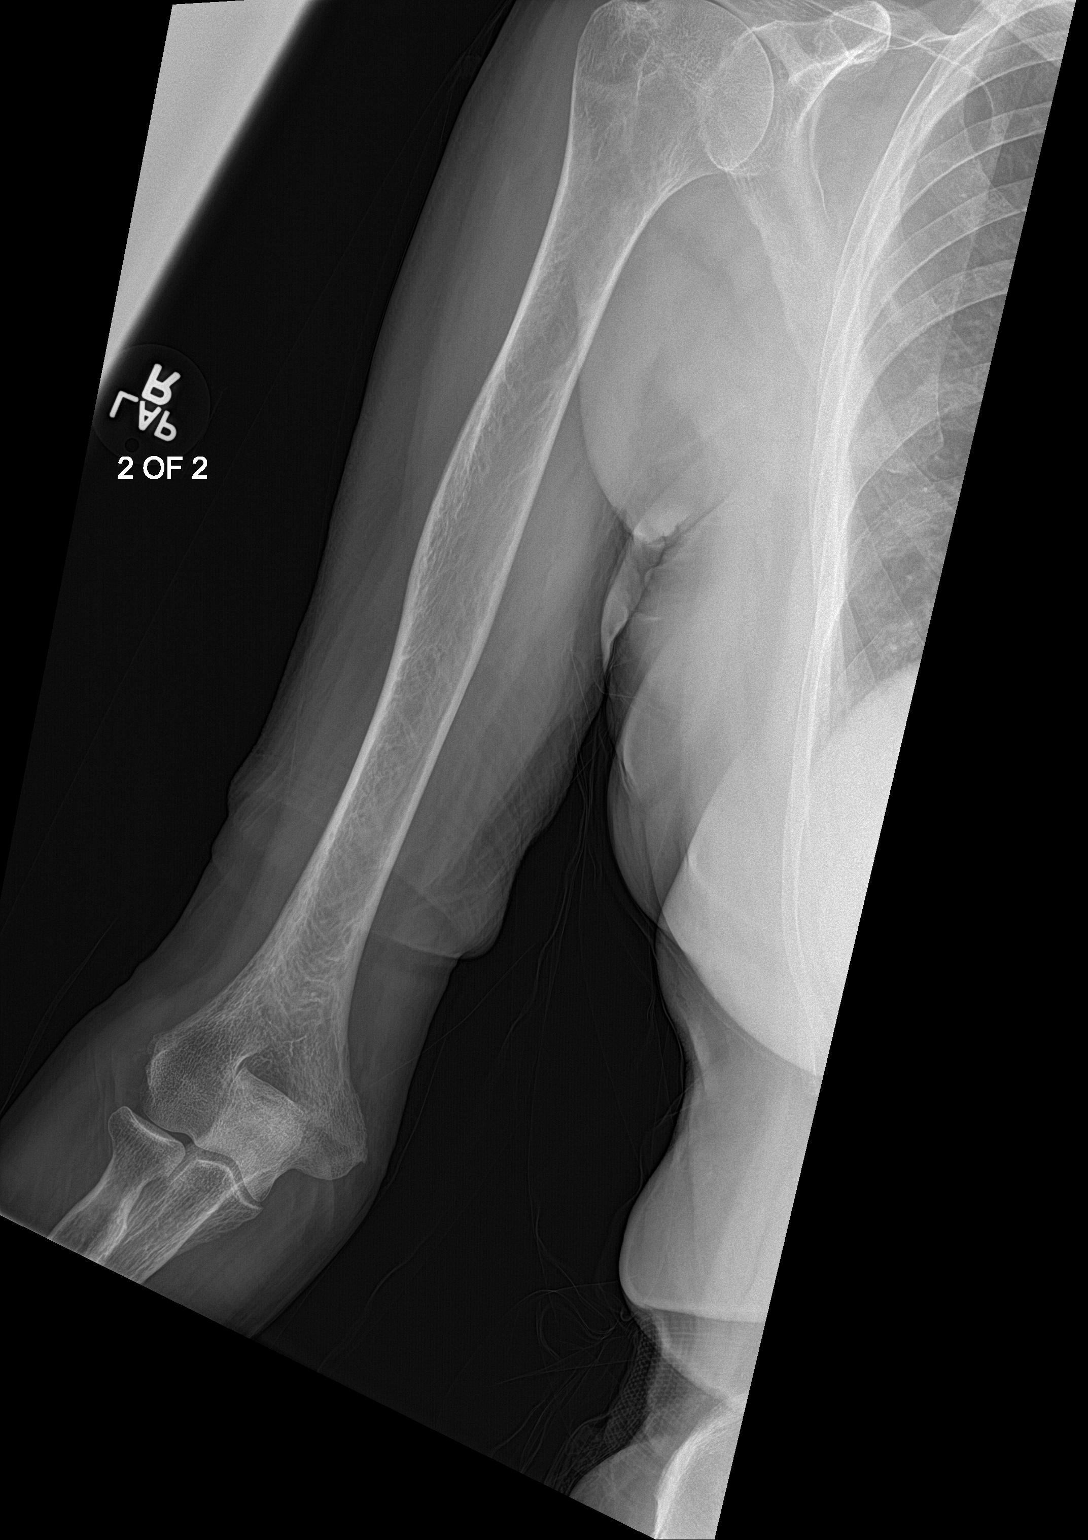
[im 3/3]
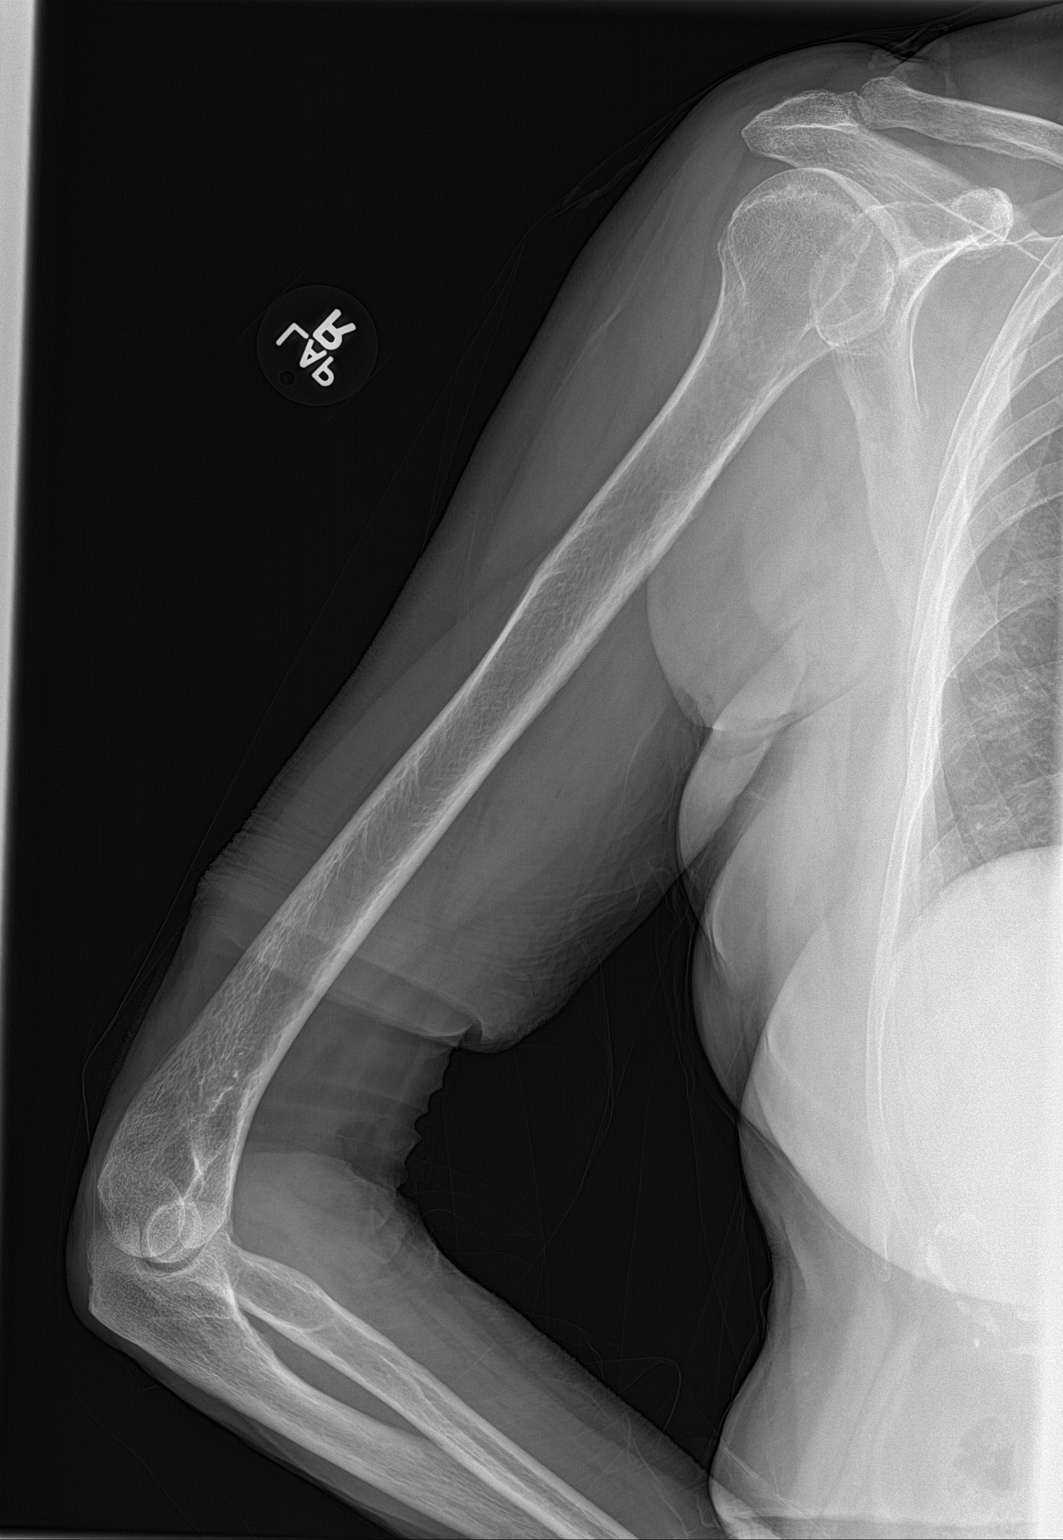

[3 of 3 positions shown; findings below may reference images not displayed]

FINDINGS: There are minimal degenerate changes over the AC joint and
glenohumeral joints. There is mild diffuse decreased bone
mineralization. There is no acute fracture or dislocation.
IMPRESSION: No acute findings.

## 2017-02-18 ENCOUNTER — Ambulatory Visit: Payer: Medicare Other | Admitting: Internal Medicine

## 2017-03-24 ENCOUNTER — Ambulatory Visit: Payer: Medicare Other | Admitting: Podiatry

## 2017-03-31 ENCOUNTER — Ambulatory Visit (INDEPENDENT_AMBULATORY_CARE_PROVIDER_SITE_OTHER): Payer: Medicare Other | Admitting: Podiatry

## 2017-03-31 DIAGNOSIS — M79676 Pain in unspecified toe(s): Secondary | ICD-10-CM | POA: Diagnosis not present

## 2017-03-31 DIAGNOSIS — B351 Tinea unguium: Secondary | ICD-10-CM

## 2017-03-31 NOTE — Progress Notes (Signed)
Complaint:  Visit Type: Patient returns to my office for continued preventative foot care services. Complaint: Patient states" my nails have grown long and thick and become painful to walk and wear shoes"  The patient presents for preventative foot care services. No changes to ROS  Podiatric Exam: Vascular: dorsalis pedis and posterior tibial pulses are palpable bilateral. Capillary return is immediate. Temperature gradient is WNL. Skin turgor WNL  Sensorium: Normal Semmes Weinstein monofilament test. Normal tactile sensation bilaterally. Nail Exam: Pt has thick disfigured discolored nails with subungual debris noted bilateral entire nail hallux through fifth toenails Ulcer Exam: There is no evidence of ulcer or pre-ulcerative changes or infection. Orthopedic Exam: Muscle tone and strength are WNL. No limitations in general ROM. No crepitus or effusions noted. Foot type and digits show no abnormalities. Bony prominences are unremarkable. Skin: No Porokeratosis. No infection or ulcers  Diagnosis:  Onychomycosis, , Pain in right toe, pain in left toes  Treatment & Plan Procedures and Treatment: Consent by patient was obtained for treatment procedures.   Debridement of mycotic and hypertrophic toenails, 1 through 5 bilateral and clearing of subungual debris. No ulceration, no infection noted.  Return Visit-Office Procedure: Patient instructed to return to the office for a follow up visit 10 weeks  for continued evaluation and treatment.    Keiera Strathman DPM 

## 2017-05-01 ENCOUNTER — Encounter: Payer: Self-pay | Admitting: Internal Medicine

## 2017-05-01 ENCOUNTER — Ambulatory Visit (INDEPENDENT_AMBULATORY_CARE_PROVIDER_SITE_OTHER): Payer: Medicare Other | Admitting: Internal Medicine

## 2017-05-01 VITALS — BP 136/80 | HR 76 | Temp 98.1°F | Wt 136.0 lb

## 2017-05-01 DIAGNOSIS — I10 Essential (primary) hypertension: Secondary | ICD-10-CM

## 2017-05-01 DIAGNOSIS — F0150 Vascular dementia without behavioral disturbance: Secondary | ICD-10-CM

## 2017-05-01 LAB — COMPREHENSIVE METABOLIC PANEL
ALT: 10 U/L (ref 0–35)
AST: 18 U/L (ref 0–37)
Albumin: 4.1 g/dL (ref 3.5–5.2)
Alkaline Phosphatase: 63 U/L (ref 39–117)
BUN: 15 mg/dL (ref 6–23)
CO2: 31 mEq/L (ref 19–32)
Calcium: 9.5 mg/dL (ref 8.4–10.5)
Chloride: 100 mEq/L (ref 96–112)
Creatinine, Ser: 0.72 mg/dL (ref 0.40–1.20)
GFR: 79.79 mL/min (ref >60.00)
Glucose, Bld: 94 mg/dL (ref 70–99)
Potassium: 4.4 mEq/L (ref 3.5–5.1)
Sodium: 136 mEq/L (ref 135–145)
Total Bilirubin: 0.5 mg/dL (ref 0.2–1.2)
Total Protein: 6.4 g/dL (ref 6.0–8.3)

## 2017-05-01 LAB — CBC
HCT: 43.1 % (ref 36.0–46.0)
Hemoglobin: 13.8 g/dL (ref 12.0–15.0)
MCHC: 32 g/dL (ref 30.0–36.0)
MCV: 89.2 fl (ref 78.0–100.0)
Platelets: 277 10*3/uL (ref 150.0–400.0)
RBC: 4.83 Mil/uL (ref 3.87–5.11)
RDW: 14.2 % (ref 11.5–15.5)
WBC: 7.2 10*3/uL (ref 4.0–10.5)

## 2017-05-01 LAB — T4, FREE: Free T4: 0.99 ng/dL (ref 0.60–1.60)

## 2017-05-01 NOTE — Assessment & Plan Note (Signed)
BP Readings from Last 3 Encounters:  05/01/17 136/80  10/28/16 118/66  08/21/16 122/70   Good control Will recheck labs since close to a year

## 2017-05-01 NOTE — Progress Notes (Signed)
   Subjective:    Patient ID: Robin Lynn, female    DOB: [birth year], 83 y.o.   MRN: [id number]  HPI Here with wife and aide for follow up of chronic health conditions  Seems to be doing okay No new concerns  No problems with the BP medication No chest pain No SOB No dizziness or syncope No edema  Aides come in twice a week---- someone shops for them and does cleaning (and helps husband with shower) They get some meals from the [place] She still does laundry Independent with ADLs  Current Outpatient Prescriptions on File Prior to Visit  Medication Sig Dispense Refill  . losartan-hydrochlorothiazide (HYZAAR) 50-12.5 MG tablet   2   No current facility-administered medications on file prior to visit.     No Known Allergies  Past Medical History:  Diagnosis Date  . Cancer ([place])   . History of breast cancer    left  . HLD (hyperlipidemia)   . HTN (hypertension)   . Urinary incontinence     Past Surgical History:  Procedure Laterality Date  . APPENDECTOMY    . BREAST LUMPECTOMY Left    with radiation    Family History  Problem Relation Age of Onset  . CAD Father   . Heart attack Father   . Cancer Unknown   . CAD Sister   . Diabetes Neg Hx     Social History   Social History  . Marital status: Married    Spouse name: N/A  . Number of children: 1  . Years of education: N/A   Occupational History  . Vocalist-- opera and showtunes     retired   Social History Main Topics  . Smoking status: Never Smoker  . Smokeless tobacco: Never Used  . Alcohol use 0.6 oz/week    1 Glasses of wine per week  . Drug use: No  . Sexual activity: Not on file   Other Topics Concern  . Not on file   Social History Narrative   2nd marriage---currently married ~55 years   1 daughter   1 son who died      Has living will   Husband is health care POA   Discussed DNR--she requests (done 02/14/16)   No tube feeds if cognitively unaware   Review of Systems Some  knee aching Sleeps well Appetite is good Weight fairly stable    Objective:   Physical Exam  Constitutional: She appears well-developed. No distress.  Neck: No thyromegaly present.  Cardiovascular: Normal rate, regular rhythm and normal heart sounds.  Exam reveals no gallop.   No murmur heard. Pulmonary/Chest: Effort normal and breath sounds normal. No respiratory distress. She has no wheezes. She has no rales.  Abdominal: Soft. There is no tenderness.  Musculoskeletal: She exhibits no edema.  Lymphadenopathy:    She has no cervical adenopathy.  Psychiatric: She has a normal mood and affect. Her behavior is normal.          Assessment & Plan:

## 2017-05-01 NOTE — Assessment & Plan Note (Signed)
Mild and still doing okay functionally---but as a couple, they would do well to move to [place]. Discussed this again Asked Santiago Glad (aide) to work on offering them more help

## 2017-05-16 ENCOUNTER — Other Ambulatory Visit: Payer: Self-pay | Admitting: Internal Medicine

## 2017-06-09 ENCOUNTER — Ambulatory Visit (INDEPENDENT_AMBULATORY_CARE_PROVIDER_SITE_OTHER): Payer: Medicare Other | Admitting: Podiatry

## 2017-06-09 ENCOUNTER — Encounter: Payer: Self-pay | Admitting: Podiatry

## 2017-06-09 DIAGNOSIS — M79676 Pain in unspecified toe(s): Secondary | ICD-10-CM | POA: Diagnosis not present

## 2017-06-09 DIAGNOSIS — B351 Tinea unguium: Secondary | ICD-10-CM

## 2017-06-09 NOTE — Progress Notes (Signed)
Complaint:  Visit Type: Patient returns to my office for continued preventative foot care services. Complaint: Patient states" my nails have grown long and thick and become painful to walk and wear shoes"  The patient presents for preventative foot care services. No changes to ROS  Podiatric Exam: Vascular: dorsalis pedis and posterior tibial pulses are palpable bilateral. Capillary return is immediate. Temperature gradient is WNL. Skin turgor WNL  Sensorium: Normal Semmes Weinstein monofilament test. Normal tactile sensation bilaterally. Nail Exam: Pt has thick disfigured discolored nails with subungual debris noted bilateral entire nail hallux through fifth toenails Ulcer Exam: There is no evidence of ulcer or pre-ulcerative changes or infection. Orthopedic Exam: Muscle tone and strength are WNL. No limitations in general ROM. No crepitus or effusions noted. Foot type and digits show no abnormalities. Bony prominences are unremarkable. Skin: No Porokeratosis. No infection or ulcers  Diagnosis:  Onychomycosis, , Pain in right toe, pain in left toes  Treatment & Plan Procedures and Treatment: Consent by patient was obtained for treatment procedures.   Debridement of mycotic and hypertrophic toenails, 1 through 5 bilateral and clearing of subungual debris. No ulceration, no infection noted.  Return Visit-Office Procedure: Patient instructed to return to the office for a follow up visit 10 weeks  for continued evaluation and treatment.    [place]ania Basham DPM 

## 2017-08-01 ENCOUNTER — Ambulatory Visit: Payer: Medicare Other | Admitting: Internal Medicine

## 2017-08-01 ENCOUNTER — Encounter: Payer: Self-pay | Admitting: Internal Medicine

## 2017-08-01 VITALS — BP 122/74 | HR 70

## 2017-08-01 DIAGNOSIS — B351 Tinea unguium: Secondary | ICD-10-CM | POA: Diagnosis not present

## 2017-08-01 NOTE — Progress Notes (Signed)
Subjective:    Patient ID: Robin Lynn, female    DOB: [birth year], 84 y.o.   MRN: [id number]  HPI  Pt requesting nail trim. Nails thick and long, getting caught on socks causing pain.   Review of Systems      Past Medical History:  Diagnosis Date  . Cancer ([place])   . History of breast cancer    left  . HLD (hyperlipidemia)   . HTN (hypertension)   . Urinary incontinence     Current Outpatient Medications  Medication Sig Dispense Refill  . losartan-hydrochlorothiazide (HYZAAR) 50-12.5 MG tablet TAKE 1 TABLET BY MOUTH ONCE DAILY FOR BP 30 tablet 11   No current facility-administered medications for this visit.     No Known Allergies  Family History  Problem Relation Age of Onset  . CAD Father   . Heart attack Father   . Cancer Unknown   . CAD Sister   . Diabetes Neg Hx     Social History   Socioeconomic History  . Marital status: Married    Spouse name: Not on file  . Number of children: 1  . Years of education: Not on file  . Highest education level: Not on file  Social Needs  . Financial resource strain: Not on file  . Food insecurity - worry: Not on file  . Food insecurity - inability: Not on file  . Transportation needs - medical: Not on file  . Transportation needs - non-medical: Not on file  Occupational History  . Occupation: Vocalist-- opera and showtunes    Comment: retired  Tobacco Use  . Smoking status: Never Smoker  . Smokeless tobacco: Never Used  Substance and Sexual Activity  . Alcohol use: Yes    Alcohol/week: 0.6 oz    Types: 1 Glasses of wine per week  . Drug use: No  . Sexual activity: Not on file  Other Topics Concern  . Not on file  Social History Narrative   2nd marriage---currently married ~55 years   1 daughter   1 son who died      Has living will   Husband is health care POA   Discussed DNR--she requests (done 02/14/16)   No tube feeds if cognitively unaware     Constitutional: Denies fever, malaise, fatigue,  headache or abrupt weight changes.  Skin: Pt reports long, thick toenails. Denies redness, rashes, lesions or ulcercations.    No other specific complaints in a complete review of systems (except as listed in HPI above).  Objective:   Physical Exam   There were no vitals taken for this visit. Wt Readings from Last 3 Encounters:  05/01/17 136 lb (61.7 kg)  10/28/16 139 lb 4 oz (63.2 kg)  08/21/16 135 lb (61.2 kg)    General: Appears her stated age, in NAD. Skin: Mycotic toenails bilaterally.  BMET    Component Value Date/Time   NA 136 05/01/2017 1457   K 4.4 05/01/2017 1457   CL 100 05/01/2017 1457   CO2 31 05/01/2017 1457   GLUCOSE 94 05/01/2017 1457   BUN 15 05/01/2017 1457   CREATININE 0.72 05/01/2017 1457   CALCIUM 9.5 05/01/2017 1457   GFRNONAA >60 07/17/2016 1125   GFRAA >60 07/17/2016 1125    Lipid Panel  No results found for: CHOL, TRIG, HDL, CHOLHDL, VLDL, LDLCALC  CBC    Component Value Date/Time   WBC 7.2 05/01/2017 1457   RBC 4.83 05/01/2017 1457   HGB 13.8 05/01/2017 1457  HCT 43.1 05/01/2017 1457   PLT 277.0 05/01/2017 1457   MCV 89.2 05/01/2017 1457   MCH 28.5 07/17/2016 1125   MCHC 32.0 05/01/2017 1457   RDW 14.2 05/01/2017 1457   LYMPHSABS 1.8 02/14/2016 1329   MONOABS 0.7 02/14/2016 1329   EOSABS 0.1 02/14/2016 1329   BASOSABS 0.0 02/14/2016 1329    Hgb A1C No results found for: HGBA1C         Assessment & Plan:   Mycotic Toenails:  Trimmed by provider using dremmel tool  Will reassess as needed Webb Silversmith, NP

## 2017-08-01 NOTE — Patient Instructions (Signed)

## 2017-08-21 ENCOUNTER — Emergency Department: Payer: Medicare Other

## 2017-08-21 ENCOUNTER — Encounter: Payer: Self-pay | Admitting: Emergency Medicine

## 2017-08-21 ENCOUNTER — Emergency Department
Admission: EM | Admit: 2017-08-21 | Discharge: 2017-08-21 | Disposition: A | Discharge status: Home / Self Care | Payer: Medicare Other | Attending: Student in an Organized Health Care Education/Training Program | Admitting: Student in an Organized Health Care Education/Training Program

## 2017-08-21 ENCOUNTER — Other Ambulatory Visit: Payer: Self-pay

## 2017-08-21 DIAGNOSIS — Z79899 Other long term (current) drug therapy: Secondary | ICD-10-CM | POA: Diagnosis not present

## 2017-08-21 DIAGNOSIS — I10 Essential (primary) hypertension: Secondary | ICD-10-CM | POA: Diagnosis not present

## 2017-08-21 DIAGNOSIS — R55 Syncope and collapse: Secondary | ICD-10-CM

## 2017-08-21 DIAGNOSIS — R4701 Aphasia: Secondary | ICD-10-CM | POA: Diagnosis present

## 2017-08-21 LAB — URINE DRUG SCREEN, QUALITATIVE (ARMC ONLY)
Amphetamines, Ur Screen: NOT DETECTED
Barbiturates, Ur Screen: NOT DETECTED
Benzodiazepine, Ur Scrn: NOT DETECTED
Cannabinoid 50 Ng, Ur ~~LOC~~: NOT DETECTED
Cocaine Metabolite,Ur ~~LOC~~: NOT DETECTED
MDMA (Ecstasy)Ur Screen: NOT DETECTED
Methadone Scn, Ur: NOT DETECTED
Opiate, Ur Screen: NOT DETECTED
Phencyclidine (PCP) Ur S: NOT DETECTED
Tricyclic, Ur Screen: NOT DETECTED

## 2017-08-21 LAB — URINALYSIS, COMPLETE (UACMP) WITH MICROSCOPIC
Bacteria, UA: NONE SEEN
Bilirubin Urine: NEGATIVE
Glucose, UA: NEGATIVE mg/dL
Hgb urine dipstick: NEGATIVE
Ketones, ur: NEGATIVE mg/dL
Nitrite: NEGATIVE
Protein, ur: NEGATIVE mg/dL
Specific Gravity, Urine: 1.013 (ref 1.005–1.030)
pH: 7 (ref 5.0–8.0)

## 2017-08-21 LAB — TROPONIN I: Troponin I: <0.03 ng/mL (ref <0.03)

## 2017-08-21 LAB — COMPREHENSIVE METABOLIC PANEL
ALT: 12 U/L — ABNORMAL LOW (ref 14–54)
AST: 23 U/L (ref 15–41)
Albumin: 3.7 g/dL (ref 3.5–5.0)
Alkaline Phosphatase: 65 U/L (ref 38–126)
Anion gap: 9 (ref 5–15)
BUN: 14 mg/dL (ref 6–20)
CO2: 27 mmol/L (ref 22–32)
Calcium: 8.9 mg/dL (ref 8.9–10.3)
Chloride: 98 mmol/L — ABNORMAL LOW (ref 101–111)
Creatinine, Ser: 0.91 mg/dL (ref 0.44–1.00)
GFR calc Af Amer: 60 mL/min — ABNORMAL LOW (ref >60)
GFR calc non Af Amer: 52 mL/min — ABNORMAL LOW (ref >60)
Glucose, Bld: 85 mg/dL (ref 65–99)
Potassium: 4.2 mmol/L (ref 3.5–5.1)
Sodium: 134 mmol/L — ABNORMAL LOW (ref 135–145)
Total Bilirubin: 0.8 mg/dL (ref 0.3–1.2)
Total Protein: 6.1 g/dL — ABNORMAL LOW (ref 6.5–8.1)

## 2017-08-21 LAB — DIFFERENTIAL
Basophils Absolute: 0 10*3/uL (ref 0–0.1)
Basophils Relative: 1 %
Eosinophils Absolute: 0.1 10*3/uL (ref 0–0.7)
Eosinophils Relative: 1 %
Lymphocytes Relative: 23 %
Lymphs Abs: 1.7 10*3/uL (ref 1.0–3.6)
Monocytes Absolute: 0.8 10*3/uL (ref 0.2–0.9)
Monocytes Relative: 11 %
Neutro Abs: 4.9 10*3/uL (ref 1.4–6.5)
Neutrophils Relative %: 64 %

## 2017-08-21 LAB — CBC
HCT: 41 % (ref 35.0–47.0)
Hemoglobin: 13.1 g/dL (ref 12.0–16.0)
MCH: 27.7 pg (ref 26.0–34.0)
MCHC: 32.1 g/dL (ref 32.0–36.0)
MCV: 86.4 fL (ref 80.0–100.0)
Platelets: 275 10*3/uL (ref 150–440)
RBC: 4.74 MIL/uL (ref 3.80–5.20)
RDW: 14.4 % (ref 11.5–14.5)
WBC: 7.6 10*3/uL (ref 3.6–11.0)

## 2017-08-21 LAB — PROTIME-INR
INR: 0.98
Prothrombin Time: 12.9 seconds (ref 11.4–15.2)

## 2017-08-21 LAB — APTT: aPTT: 29 seconds (ref 24–36)

## 2017-08-21 NOTE — ED Notes (Signed)
Pt states she does not have to urinate at this time, pt provided cup of water.

## 2017-08-21 NOTE — ED Notes (Signed)
Patient transported to CT 

## 2017-08-21 NOTE — ED Provider Notes (Signed)
[facility] Emergency Department Provider Note    None    (approximate)  I have reviewed the triage vital signs and the nursing notes.   HISTORY  Chief Complaint Aphasia    HPI Robin Lynn is a 84 y.o. female with history of CAD, hypertension hyperlipidemia as well as reported mild dementia presents from nursing facility for report of brief slurred speech that occurred around breakfast though EMS and staff at nursing facility is unable to confirm duration, time or symptoms.  Patient arrives to the ER and is uncertain as to why she is here.  She denies any numbness or tingling.  No chest pain.  No shortness of breath.  No nausea or vomiting.  No blurry vision.  Does not recall any history of stroke.  Past Medical History:  Diagnosis Date  . Cancer ([place])   . History of breast cancer    left  . HLD (hyperlipidemia)   . HTN (hypertension)   . Urinary incontinence    Family History  Problem Relation Age of Onset  . CAD Father   . Heart attack Father   . Cancer Unknown   . CAD Sister   . Diabetes Neg Hx    Past Surgical History:  Procedure Laterality Date  . APPENDECTOMY    . BREAST LUMPECTOMY Left    with radiation   Patient Active Problem List   Diagnosis Date Noted  . Preventative health care 08/21/2016  . Advance directive discussed with patient 08/21/2016  . Vascular dementia without behavioral disturbance 02/14/2016  . Syncope 03/23/2015  . HTN (hypertension) 03/23/2015  . HLD (hyperlipidemia) 03/23/2015      Prior to Admission medications   Medication Sig Start Date End Date Taking? Authorizing Provider  acetaminophen (TYLENOL) 325 MG tablet Take 325-650 mg by mouth every 4 (four) hours as needed.   Yes [provider]  alum & mag hydroxide-simeth (MAALOX/MYLANTA) 200-200-20 MG/5ML suspension Take 30 mLs by mouth every 4 (four) hours as needed for indigestion or heartburn.   Yes [provider]  bismuth  subsalicylate (KAOPECTATE) 262 MG/15ML suspension Take 10 mLs by mouth 3 (three) times daily as needed.   Yes [provider]  carbamide peroxide (DEBROX) 6.5 % OTIC solution 5 drops daily as needed. For 5 days   Yes [provider]  diphenhydrAMINE (BENADRYL) 25 mg capsule Take 25 mg by mouth every 4 (four) hours as needed for itching or allergies.   Yes [provider]  guaiFENesin-dextromethorphan (ROBITUSSIN DM) 100-10 MG/5ML syrup Take 10 mLs by mouth every 4 (four) hours as needed for cough.   Yes [provider]  losartan-hydrochlorothiazide (HYZAAR) 50-12.5 MG tablet TAKE 1 TABLET BY MOUTH ONCE DAILY FOR BP 05/16/17  Yes Viviana Simpler I, MD  magnesium hydroxide (MILK OF MAGNESIA) 400 MG/5ML suspension Take 30 mLs by mouth daily as needed for mild constipation.   Yes [provider]  nystatin (MYCOSTATIN/NYSTOP) powder Apply 1 g topically 2 (two) times daily. To folds and under the breasts   Yes [provider]  therapeutic multivitamin-minerals (THERAGRAN-M) tablet Take 1 tablet by mouth daily.   Yes [provider]  Vitamin D, Cholecalciferol, 1000 units CAPS Take by mouth.   Yes [provider]  vitamin E 400 UNIT capsule Take 400 Units by mouth daily.   Yes [provider]    Allergies Patient has no known allergies.    Social History Social History   Tobacco Use  .  Smoking status: Never Smoker  . Smokeless tobacco: Never Used  Substance Use Topics  . Alcohol use: Yes    Alcohol/week: 0.6 oz    Types: 1 Glasses of wine per week  . Drug use: No    Review of Systems Patient denies headaches, rhinorrhea, blurry vision, numbness, shortness of breath, chest pain, edema, cough, abdominal pain, nausea, vomiting, diarrhea, dysuria, fevers, rashes or hallucinations unless otherwise stated above in HPI. ____________________________________________   PHYSICAL EXAM:  VITAL SIGNS: Vitals:   08/21/17  1214 08/21/17 1330  BP: (!) 152/62 (!) 156/76  Pulse: 72 73  Resp: 13 15  Temp:    SpO2: 98% 100%    Constitutional: Alert, well appearing and in no acute distress. Eyes: Conjunctivae are normal.  Head: Atraumatic. Nose: No congestion/rhinnorhea. Mouth/Throat: Mucous membranes are moist.   Neck: No stridor. Painless ROM.  Cardiovascular: Normal rate, regular rhythm. Grossly normal heart sounds.  Good peripheral circulation. Respiratory: Normal respiratory effort.  No retractions. Lungs CTAB. Gastrointestinal: Soft and nontender. No distention. No abdominal bruits. No CVA tenderness. Genitourinary:  Musculoskeletal: No lower extremity tenderness nor edema.  No joint effusions. Neurologic:  Normal speech and language. No gross focal neurologic deficits are appreciated. No facial droop Skin:  Skin is warm, dry and intact. No rash noted. Psychiatric: Mood and affect are normal. Speech and behavior are normal.  ____________________________________________   LABS (all labs ordered are listed, but only abnormal results are displayed)  Results for orders placed or performed during the hospital encounter of 08/21/17 (from the past 24 hour(s))  Protime-INR     Status: None   Collection Time: 08/21/17 11:33 AM  Result Value Ref Range   Prothrombin Time 12.9 11.4 - 15.2 seconds   INR 0.98   APTT     Status: None   Collection Time: 08/21/17 11:33 AM  Result Value Ref Range   aPTT 29 24 - 36 seconds  CBC     Status: None   Collection Time: 08/21/17 11:33 AM  Result Value Ref Range   WBC 7.6 3.6 - 11.0 K/uL   RBC 4.74 3.80 - 5.20 MIL/uL   Hemoglobin 13.1 12.0 - 16.0 g/dL   HCT 41.0 35.0 - 47.0 %   MCV 86.4 80.0 - 100.0 fL   MCH 27.7 26.0 - 34.0 pg   MCHC 32.1 32.0 - 36.0 g/dL   RDW 14.4 11.5 - 14.5 %   Platelets 275 150 - 440 K/uL  Differential     Status: None   Collection Time: 08/21/17 11:33 AM  Result Value Ref Range   Neutrophils Relative % 64 %   Neutro Abs 4.9 1.4 - 6.5  K/uL   Lymphocytes Relative 23 %   Lymphs Abs 1.7 1.0 - 3.6 K/uL   Monocytes Relative 11 %   Monocytes Absolute 0.8 0.2 - 0.9 K/uL   Eosinophils Relative 1 %   Eosinophils Absolute 0.1 0 - 0.7 K/uL   Basophils Relative 1 %   Basophils Absolute 0.0 0 - 0.1 K/uL  Comprehensive metabolic panel     Status: Abnormal   Collection Time: 08/21/17 11:33 AM  Result Value Ref Range   Sodium 134 (L) 135 - 145 mmol/L   Potassium 4.2 3.5 - 5.1 mmol/L   Chloride 98 (L) 101 - 111 mmol/L   CO2 27 22 - 32 mmol/L   Glucose, Bld 85 65 - 99 mg/dL   BUN 14 6 - 20 mg/dL   Creatinine, Ser 0.91 0.44 - 1.00  mg/dL   Calcium 8.9 8.9 - 10.3 mg/dL   Total Protein 6.1 (L) 6.5 - 8.1 g/dL   Albumin 3.7 3.5 - 5.0 g/dL   AST 23 15 - 41 U/L   ALT 12 (L) 14 - 54 U/L   Alkaline Phosphatase 65 38 - 126 U/L   Total Bilirubin 0.8 0.3 - 1.2 mg/dL   GFR calc non Af Amer 52 (L) >60 mL/min   GFR calc Af Amer 60 (L) >60 mL/min   Anion gap 9 5 - 15  Troponin I     Status: None   Collection Time: 08/21/17 11:33 AM  Result Value Ref Range   Troponin I <0.03 <0.03 ng/mL  Urine Drug Screen, Qualitative     Status: None   Collection Time: 08/21/17 11:33 AM  Result Value Ref Range   Tricyclic, Ur Screen NONE DETECTED NONE DETECTED   Amphetamines, Ur Screen NONE DETECTED NONE DETECTED   MDMA (Ecstasy)Ur Screen NONE DETECTED NONE DETECTED   Cocaine Metabolite,Ur [place] NONE DETECTED NONE DETECTED   Opiate, Ur Screen NONE DETECTED NONE DETECTED   Phencyclidine (PCP) Ur S NONE DETECTED NONE DETECTED   Cannabinoid 50 Ng, Ur [place] NONE DETECTED NONE DETECTED   Barbiturates, Ur Screen NONE DETECTED NONE DETECTED   Benzodiazepine, Ur Scrn NONE DETECTED NONE DETECTED   Methadone Scn, Ur NONE DETECTED NONE DETECTED  Urinalysis, Complete w Microscopic     Status: Abnormal   Collection Time: 08/21/17 12:25 PM  Result Value Ref Range   Color, Urine YELLOW (A) YELLOW   APPearance CLEAR (A) CLEAR   Specific Gravity, Urine 1.013 1.005 -  1.030   pH 7.0 5.0 - 8.0   Glucose, UA NEGATIVE NEGATIVE mg/dL   Hgb urine dipstick NEGATIVE NEGATIVE   Bilirubin Urine NEGATIVE NEGATIVE   Ketones, ur NEGATIVE NEGATIVE mg/dL   Protein, ur NEGATIVE NEGATIVE mg/dL   Nitrite NEGATIVE NEGATIVE   Leukocytes, UA TRACE (A) NEGATIVE   RBC / HPF 0-5 0 - 5 RBC/hpf   WBC, UA 0-5 0 - 5 WBC/hpf   Bacteria, UA NONE SEEN NONE SEEN   Squamous Epithelial / LPF 0-5 (A) NONE SEEN   Mucus PRESENT    ____________________________________________  EKG My review and personal interpretation at Time: 11:23   Indication: near syncope  Rate: 75  Rhythm: sinus Axis: normal Other: normal intervals, no stemi ____________________________________________  RADIOLOGY  I personally reviewed all radiographic images ordered to evaluate for the above acute complaints and reviewed radiology reports and findings.  These findings were personally discussed with the patient.  Please see medical record for radiology report.  ____________________________________________   PROCEDURES  Procedure(s) performed:  Procedures    Critical Care performed: no ____________________________________________   INITIAL IMPRESSION / ASSESSMENT AND PLAN / ED COURSE  Pertinent labs & imaging results that were available during my care of the patient were reviewed by me and considered in my medical decision making (see chart for details).  DDX: dehydration, orthostasis, uti, cva, hypoglycemia, dysrhythmia  TYREANNA BISESI is a 84 y.o. who presents to the ED with the above.  She arrives afebrile and hemodynamically stable.  She is in no acute distress.  Seems most consistent with near syncopal episode.  No signs or symptoms of acute CVA.  Blood work will be sent for the above differential.  CT head will be ordered to evaluate for evidence of subdural or IPH.  The patient will be placed on continuous pulse oximetry and telemetry for monitoring.  Laboratory evaluation will be sent to  evaluate for the above complaints.     Clinical Course as of Aug 21 1348  Thu Aug 21, 2017  1320 Patient reassessed.  Remains hemodynamically stable.  No ectopy or dysrhythmia on the monitor.  No evidence of CVA.  No evidence of ACS.  Speaking again with staff at nursing facility does sound like she was having a near syncopal episode which she has had many of in the past.  Husband also reporting increased stressors at home.  No orthostasis.  At this point do believe patient is stable and appropriate for follow-up as an outpatient.  [PR]    Clinical Course User Index [PR] Merlyn Lot, MD     ____________________________________________   FINAL CLINICAL IMPRESSION(S) / ED DIAGNOSES  Final diagnoses:  Near syncope      NEW MEDICATIONS STARTED DURING THIS VISIT:  New Prescriptions   No medications on file     Note:  This document was prepared using Dragon voice recognition software and may include unintentional dictation errors.    Merlyn Lot, MD 08/21/17 1350

## 2017-08-21 NOTE — ED Triage Notes (Signed)
Pt in via [facility] from [facility]; per facility, pt with episode of slurred speech after breakfast, time of onset unknown, pt last known well before breakfast, also per facility.  Pt with no complaints at this time.  Vitals WDL, NAD noted at this time.

## 2017-08-28 ENCOUNTER — Ambulatory Visit: Payer: Medicare Other | Admitting: Internal Medicine

## 2017-08-28 ENCOUNTER — Encounter: Payer: Self-pay | Admitting: Internal Medicine

## 2017-08-28 VITALS — BP 154/82 | HR 68 | Temp 98.8°F | Resp 20 | Wt 128.6 lb

## 2017-08-28 DIAGNOSIS — J4521 Mild intermittent asthma with (acute) exacerbation: Secondary | ICD-10-CM

## 2017-08-28 DIAGNOSIS — I10 Essential (primary) hypertension: Secondary | ICD-10-CM

## 2017-08-28 DIAGNOSIS — F0150 Vascular dementia without behavioral disturbance: Secondary | ICD-10-CM

## 2017-08-28 DIAGNOSIS — J45909 Unspecified asthma, uncomplicated: Secondary | ICD-10-CM | POA: Insufficient documentation

## 2017-08-28 NOTE — Assessment & Plan Note (Signed)
BP Readings from Last 3 Encounters:  08/28/17 (!) 154/82  08/21/17 (!) 156/76  08/01/17 122/74   Reasonable control for her age No changes

## 2017-08-28 NOTE — Assessment & Plan Note (Signed)
Has had some decline since my last visit---but now in [place] so she can get the support she needs

## 2017-08-28 NOTE — Progress Notes (Signed)
Subjective:    Patient ID: Robin Lynn, female    DOB: [birth year], 84 y.o.   MRN: [id number]  HPI Initial visit here in [facility] assisted living to review chronic medical conditions Reviewed status with Luellen Pucker RN Generally had adjusted well but now stress with husband in SNF due to apparent stroke and cognitive decline On preventative tamiflu due to recent documented influenza A case here  Overall she is satisfied here Needs assist for showers--otherwise independent Generally continent She notices "I am not as sharp as I used to be"  No chest pain No palpitations No dizziness or syncope No edema  Has some "sinus" congestion No chest congestion Only occasional cough No SOB CXR showed bronchitis a week ago  ER visit a week ago Had slurred speech but no other neurologic symptoms Resolved quickly and CT of head negative  Always a worrier No real depression States "don't get old"  Current Outpatient Medications on File Prior to Visit  Medication Sig Dispense Refill  . acetaminophen (TYLENOL) 325 MG tablet Take 325-650 mg by mouth every 4 (four) hours as needed.    Marland Kitchen alum & mag hydroxide-simeth (MAALOX/MYLANTA) 200-200-20 MG/5ML suspension Take 30 mLs by mouth every 4 (four) hours as needed for indigestion or heartburn.    . bismuth subsalicylate (KAOPECTATE) 262 MG/15ML suspension Take 10 mLs by mouth 3 (three) times daily as needed.    . carbamide peroxide (DEBROX) 6.5 % OTIC solution 5 drops daily as needed. For 5 days    . guaiFENesin-dextromethorphan (ROBITUSSIN DM) 100-10 MG/5ML syrup Take 10 mLs by mouth every 4 (four) hours as needed for cough.    . losartan-hydrochlorothiazide (HYZAAR) 50-12.5 MG tablet TAKE 1 TABLET BY MOUTH ONCE DAILY FOR BP 30 tablet 11  . magnesium hydroxide (MILK OF MAGNESIA) 400 MG/5ML suspension Take 30 mLs by mouth daily as needed for mild constipation.    Marland Kitchen nystatin (MYCOSTATIN/NYSTOP) powder Apply 1 g topically 2 (two) times daily. To  folds and under the breasts    . therapeutic multivitamin-minerals (THERAGRAN-M) tablet Take 1 tablet by mouth daily.    . Vitamin D, Cholecalciferol, 1000 units CAPS Take by mouth.    . vitamin E 400 UNIT capsule Take 400 Units by mouth daily.     No current facility-administered medications on file prior to visit.     No Known Allergies  Past Medical History:  Diagnosis Date  . Cancer ([place])   . History of breast cancer    left  . HLD (hyperlipidemia)   . HTN (hypertension)   . Urinary incontinence     Past Surgical History:  Procedure Laterality Date  . APPENDECTOMY    . BREAST LUMPECTOMY Left    with radiation    Family History  Problem Relation Age of Onset  . CAD Father   . Heart attack Father   . Cancer Unknown   . CAD Sister   . Diabetes Neg Hx     Social History   Socioeconomic History  . Marital status: Married    Spouse name: Not on file  . Number of children: 1  . Years of education: Not on file  . Highest education level: Not on file  Social Needs  . Financial resource strain: Not on file  . Food insecurity - worry: Not on file  . Food insecurity - inability: Not on file  . Transportation needs - medical: Not on file  . Transportation needs - non-medical: Not on file  Occupational  History  . Occupation: Vocalist-- opera and showtunes    Comment: retired  Tobacco Use  . Smoking status: Never Smoker  . Smokeless tobacco: Never Used  Substance and Sexual Activity  . Alcohol use: Yes    Alcohol/week: 0.6 oz    Types: 1 Glasses of wine per week  . Drug use: No  . Sexual activity: Not on file  Other Topics Concern  . Not on file  Social History Narrative   2nd marriage---currently married ~55 years   1 daughter   1 son who died      Has living will   Husband is health care POA   Discussed DNR--she requests (done 02/14/16)   No tube feeds if cognitively unaware   Review of Systems Appetite is fair Weight down a little--but not clear if it  is since here---or before Sleeps fairly well Bowels are fine No trouble voiding and no incontinence    Objective:   Physical Exam  Constitutional: She appears well-developed. No distress.  Neck: No thyromegaly present.  Cardiovascular: Normal rate, regular rhythm and normal heart sounds. Exam reveals no gallop.  No murmur heard. Pulmonary/Chest: Effort normal. No respiratory distress.  Diffuse rhonchi and mild expiratory wheezing  Abdominal: Soft.  Musculoskeletal: She exhibits no edema.  Lymphadenopathy:    She has no cervical adenopathy.  Psychiatric: She has a normal mood and affect. Her behavior is normal.          Assessment & Plan:

## 2017-08-28 NOTE — Assessment & Plan Note (Signed)
Likely atypical infection with mild but persistent symptoms She complains mostly of head congestion Will give 3 days of prednisone Doxy x 5 days--though not likely to make much difference

## 2017-09-01 ENCOUNTER — Encounter: Payer: Medicare Other | Admitting: Internal Medicine

## 2017-09-08 ENCOUNTER — Ambulatory Visit: Payer: Medicare Other | Admitting: Podiatry

## 2017-09-17 ENCOUNTER — Ambulatory Visit: Payer: Medicare Other | Admitting: Internal Medicine

## 2017-09-17 VITALS — BP 110/60 | HR 80 | Temp 98.1°F | Resp 18 | Wt 130.0 lb

## 2017-09-17 DIAGNOSIS — M545 Low back pain, unspecified: Secondary | ICD-10-CM

## 2017-09-17 DIAGNOSIS — B029 Zoster without complications: Secondary | ICD-10-CM

## 2017-10-08 ENCOUNTER — Encounter: Payer: Self-pay | Admitting: Internal Medicine

## 2017-10-08 MED ORDER — VALACYCLOVIR HCL 1 G PO TABS: 1000.0000 mg | ORAL_TABLET | Freq: Three times a day (TID) | ORAL | 0 refills | Status: DC

## 2017-10-08 NOTE — Patient Instructions (Signed)

## 2017-10-08 NOTE — Progress Notes (Signed)
Subjective:    Patient ID: Robin Lynn, female    DOB: 03-27-1919, 84 y.o.   MRN: [id number]  HPI  Asked to check resident in [address] C/o low back/sacral pain and rash to buttocks Resident describes pain as achy, sore, burning. Pain does not radiate. She denies numbness, tingling, weakness or loss of bowel or bladder. Rash to bilateral buttocks, RN reports patient has been scratching until the areas are open They have not put anything on the rash. It has not spread. Resident and RN denies recent fall.   Review of Systems   Past Medical History:  Diagnosis Date  . Cancer ([place])   . History of breast cancer    left  . HLD (hyperlipidemia)   . HTN (hypertension)   . Urinary incontinence     Current Outpatient Medications  Medication Sig Dispense Refill  . acetaminophen (TYLENOL) 325 MG tablet Take 325-650 mg by mouth every 4 (four) hours as needed.    Marland Kitchen alum & mag hydroxide-simeth (MAALOX/MYLANTA) 200-200-20 MG/5ML suspension Take 30 mLs by mouth every 4 (four) hours as needed for indigestion or heartburn.    . bismuth subsalicylate (KAOPECTATE) 262 MG/15ML suspension Take 10 mLs by mouth 3 (three) times daily as needed.    . carbamide peroxide (DEBROX) 6.5 % OTIC solution 5 drops daily as needed. For 5 days    . guaiFENesin-dextromethorphan (ROBITUSSIN DM) 100-10 MG/5ML syrup Take 10 mLs by mouth every 4 (four) hours as needed for cough.    . losartan-hydrochlorothiazide (HYZAAR) 50-12.5 MG tablet TAKE 1 TABLET BY MOUTH ONCE DAILY FOR BP 30 tablet 11  . magnesium hydroxide (MILK OF MAGNESIA) 400 MG/5ML suspension Take 30 mLs by mouth daily as needed for mild constipation.    Marland Kitchen nystatin (MYCOSTATIN/NYSTOP) powder Apply 1 g topically 2 (two) times daily. To folds and under the breasts    . therapeutic multivitamin-minerals (THERAGRAN-M) tablet Take 1 tablet by mouth daily.    . Vitamin D, Cholecalciferol, 1000 units CAPS Take by mouth.    . vitamin E 400 UNIT capsule Take 400  Units by mouth daily.     No current facility-administered medications for this visit.     No Known Allergies  Family History  Problem Relation Age of Onset  . CAD Father   . Heart attack Father   . Cancer Unknown   . CAD Sister   . Diabetes Neg Hx     Social History   Socioeconomic History  . Marital status: Married    Spouse name: Not on file  . Number of children: 1  . Years of education: Not on file  . Highest education level: Not on file  Occupational History  . Occupation: Vocalist-- opera and showtunes    Comment: retired  Scientific laboratory technician  . Financial resource strain: Not on file  . Food insecurity:    Worry: Not on file    Inability: Not on file  . Transportation needs:    Medical: Not on file    Non-medical: Not on file  Tobacco Use  . Smoking status: Never Smoker  . Smokeless tobacco: Never Used  Substance and Sexual Activity  . Alcohol use: Yes    Alcohol/week: 0.6 oz    Types: 1 Glasses of wine per week  . Drug use: No  . Sexual activity: Not on file  Lifestyle  . Physical activity:    Days per week: Not on file    Minutes per session: Not on file  .  Stress: Not on file  Relationships  . Social connections:    Talks on phone: Not on file    Gets together: Not on file    Attends religious service: Not on file    Active member of club or organization: Not on file    Attends meetings of clubs or organizations: Not on file    Relationship status: Not on file  . Intimate partner violence:    Fear of current or ex partner: Not on file    Emotionally abused: Not on file    Physically abused: Not on file    Forced sexual activity: Not on file  Other Topics Concern  . Not on file  Social History Narrative   2nd marriage---currently married ~55 years   1 daughter   1 son who died      Has living will   Husband is health care POA   Discussed DNR--she requests (done 02/14/16)   No tube feeds if cognitively unaware     Constitutional: Denies  fever, malaise, fatigue, headache or abrupt weight changes.  Gastrointestinal: Denies abdominal pain, bloating, constipation, diarrhea or blood in the stool.  GU: Denies urgency, frequency, pain with urination, burning sensation, blood in urine, odor or discharge. Musculoskeletal: Pt reports low back pain. Denies decrease in range of motion, difficulty with gait, or joint swelling.  Skin: Pt reports rash to bilateral buttocks.    No other specific complaints in a complete review of systems (except as listed in HPI above).     Objective:   Physical Exam   BP 110/60   Pulse 80   Temp 98.1 F (36.7 C)   Resp 18   Wt 130 lb (59 kg)   BMI 20.98 kg/m   Wt Readings from Last 3 Encounters:  08/28/17 128 lb 9.6 oz (58.3 kg)  08/21/17 135 lb (61.2 kg)  05/01/17 136 lb (61.7 kg)    General: Appears her stated age, well developed, well nourished in NAD. Skin: Excoriation noted to bilateral buttocks. Grouped vesicular lesions on erythematous base noted on left buttock. Musculoskeletal: Decreased flexion and extension of the lumbar spine. No pain with palpation over the lumbar spine. Gait slow and steady.   BMET    Component Value Date/Time   NA 134 (L) 08/21/2017 1133   K 4.2 08/21/2017 1133   CL 98 (L) 08/21/2017 1133   CO2 27 08/21/2017 1133   GLUCOSE 85 08/21/2017 1133   BUN 14 08/21/2017 1133   CREATININE 0.91 08/21/2017 1133   CALCIUM 8.9 08/21/2017 1133   GFRNONAA 52 (L) 08/21/2017 1133   GFRAA 60 (L) 08/21/2017 1133    Lipid Panel  No results found for: CHOL, TRIG, HDL, CHOLHDL, VLDL, LDLCALC  CBC    Component Value Date/Time   WBC 7.6 08/21/2017 1133   RBC 4.74 08/21/2017 1133   HGB 13.1 08/21/2017 1133   HCT 41.0 08/21/2017 1133   PLT 275 08/21/2017 1133   MCV 86.4 08/21/2017 1133   MCH 27.7 08/21/2017 1133   MCHC 32.1 08/21/2017 1133   RDW 14.4 08/21/2017 1133   LYMPHSABS 1.7 08/21/2017 1133   MONOABS 0.8 08/21/2017 1133   EOSABS 0.1 08/21/2017 1133    BASOSABS 0.0 08/21/2017 1133    Hgb A1C No results found for: HGBA1C         Assessment & Plan:   Low Back Pain secondary to Herpes Zoster:  Valtrex 1 gm TID x 7 days Tylenol extra strength as needed for pain Can  use Gabapentin if needed for pain not controlled with Tylenol Contact precautions until rash scabbed  Will reassess as needed Webb Silversmith, NP

## 2017-11-17 ENCOUNTER — Encounter: Payer: Self-pay | Admitting: Internal Medicine

## 2017-12-03 ENCOUNTER — Ambulatory Visit: Payer: Medicare Other | Admitting: Internal Medicine

## 2017-12-03 DIAGNOSIS — F0150 Vascular dementia without behavioral disturbance: Secondary | ICD-10-CM | POA: Diagnosis not present

## 2017-12-03 DIAGNOSIS — E7800 Pure hypercholesterolemia, unspecified: Secondary | ICD-10-CM

## 2017-12-03 DIAGNOSIS — Z853 Personal history of malignant neoplasm of breast: Secondary | ICD-10-CM | POA: Diagnosis not present

## 2017-12-03 DIAGNOSIS — I10 Essential (primary) hypertension: Secondary | ICD-10-CM

## 2017-12-05 ENCOUNTER — Encounter: Payer: Self-pay | Admitting: Internal Medicine

## 2017-12-05 DIAGNOSIS — Z853 Personal history of malignant neoplasm of breast: Secondary | ICD-10-CM | POA: Insufficient documentation

## 2017-12-05 NOTE — Assessment & Plan Note (Signed)
In remission.

## 2017-12-05 NOTE — Assessment & Plan Note (Signed)
Given age, no cholesterol lowering medications

## 2017-12-05 NOTE — Progress Notes (Signed)
Subjective:    Patient ID: Robin Lynn, female    DOB: 1919-01-19, 84 y.o.   MRN: [id number]  HPI  Routine follow up for resident in [address] Reviewed with RN- no concerns Resident has no concerns but keeps repeating "I'm just getting older, this all happens when we get older" Sleeps well, walks with walker. Appetite pretty good. Weight stable. Some mild urinary incontinence but manageable. Bowels okay. She denies chest pain or shortness of breath.  HTN: Controlled on Losartan HCT.  HLD: She is not on any cholesterol lowering medications at this time.   Hx of Breast Cancer: s/p lumpectomy. In remission.  Dementia: Mild cognitive issues. No meds. ALF appropriate.  Review of Systems      Past Medical History:  Diagnosis Date  . Cancer ([place])   . History of breast cancer    left  . HLD (hyperlipidemia)   . HTN (hypertension)   . Urinary incontinence     Current Outpatient Medications  Medication Sig Dispense Refill  . acetaminophen (TYLENOL) 325 MG tablet Take 325-650 mg by mouth every 4 (four) hours as needed.    Marland Kitchen alum & mag hydroxide-simeth (MAALOX/MYLANTA) 200-200-20 MG/5ML suspension Take 30 mLs by mouth every 4 (four) hours as needed for indigestion or heartburn.    . bismuth subsalicylate (KAOPECTATE) 262 MG/15ML suspension Take 10 mLs by mouth 3 (three) times daily as needed.    . carbamide peroxide (DEBROX) 6.5 % OTIC solution 5 drops daily as needed. For 5 days    . guaiFENesin-dextromethorphan (ROBITUSSIN DM) 100-10 MG/5ML syrup Take 10 mLs by mouth every 4 (four) hours as needed for cough.    . losartan-hydrochlorothiazide (HYZAAR) 50-12.5 MG tablet TAKE 1 TABLET BY MOUTH ONCE DAILY FOR BP 30 tablet 11  . magnesium hydroxide (MILK OF MAGNESIA) 400 MG/5ML suspension Take 30 mLs by mouth daily as needed for mild constipation.    Marland Kitchen nystatin (MYCOSTATIN/NYSTOP) powder Apply 1 g topically 2 (two) times daily. To folds and under the breasts    . therapeutic  multivitamin-minerals (THERAGRAN-M) tablet Take 1 tablet by mouth daily.    . valACYclovir (VALTREX) 1000 MG tablet Take 1 tablet (1,000 mg total) by mouth 3 (three) times daily. 21 tablet 0  . Vitamin D, Cholecalciferol, 1000 units CAPS Take by mouth.    . vitamin E 400 UNIT capsule Take 400 Units by mouth daily.     No current facility-administered medications for this visit.     No Known Allergies  Family History  Problem Relation Age of Onset  . CAD Father   . Heart attack Father   . Cancer Unknown   . CAD Sister   . Diabetes Neg Hx     Social History   Socioeconomic History  . Marital status: Married    Spouse name: Not on file  . Number of children: 1  . Years of education: Not on file  . Highest education level: Not on file  Occupational History  . Occupation: Vocalist-- opera and showtunes    Comment: retired  Scientific laboratory technician  . Financial resource strain: Not on file  . Food insecurity:    Worry: Not on file    Inability: Not on file  . Transportation needs:    Medical: Not on file    Non-medical: Not on file  Tobacco Use  . Smoking status: Never Smoker  . Smokeless tobacco: Never Used  Substance and Sexual Activity  . Alcohol use: Yes  Alcohol/week: 0.6 oz    Types: 1 Glasses of wine per week  . Drug use: No  . Sexual activity: Not on file  Lifestyle  . Physical activity:    Days per week: Not on file    Minutes per session: Not on file  . Stress: Not on file  Relationships  . Social connections:    Talks on phone: Not on file    Gets together: Not on file    Attends religious service: Not on file    Active member of club or organization: Not on file    Attends meetings of clubs or organizations: Not on file    Relationship status: Not on file  . Intimate partner violence:    Fear of current or ex partner: Not on file    Emotionally abused: Not on file    Physically abused: Not on file    Forced sexual activity: Not on file  Other Topics  Concern  . Not on file  Social History Narrative   2nd marriage---currently married ~55 years   1 daughter   1 son who died      Has living will   Husband is health care POA   Discussed DNR--she requests (done 02/14/16)   No tube feeds if cognitively unaware     Constitutional: Denies fever, malaise, fatigue, headache or abrupt weight changes.  Respiratory: Denies difficulty breathing, shortness of breath, cough or sputum production.   Cardiovascular: Denies chest pain, chest tightness, palpitations or swelling in the hands or feet.  Gastrointestinal: Denies abdominal pain, bloating, constipation, diarrhea or blood in the stool.  GU: Pt reports mild incontinence. Denies urgency, frequency, pain with urination, burning sensation, blood in urine, odor or discharge. Musculoskeletal: Pt reports mild difficulty with gait. Denies decrease in range of motion, muscle pain or joint pain and swelling.  Skin: Denies redness, rashes, lesions or ulcercations.  Neurological: Pt reports difficulty with memory. Denies dizziness, difficulty with speech or problems with balance and coordination.  Psych: Denies anxiety, depression, SI/HI.  No other specific complaints in a complete review of systems (except as listed in HPI above).  Objective:   Physical Exam   BP (!) 112/58   Pulse 68   Resp 18   Wt 134 lb 3.2 oz (60.9 kg)   BMI 21.66 kg/m  Wt Readings from Last 3 Encounters:  12/05/17 134 lb 3.2 oz (60.9 kg)  10/08/17 130 lb (59 kg)  08/28/17 128 lb 9.6 oz (58.3 kg)    General: Appears her stated age, well developed, well nourished in NAD. Skin: Warm, dry and intact.  Cardiovascular: Normal rate and rhythm. S1,S2 noted.  No murmur, rubs or gallops noted. No JVD or BLE edema. Pulmonary/Chest: Normal effort and positive vesicular breath sounds. No respiratory distress. No wheezes, rales or ronchi noted.  Abdomen: Soft and nontender. Normal bowel sounds.  Musculoskeletal: Gait slow and  steady with use of rolling walker.  Neurological: Alert and oriented. Repeats herself often. Psychiatric: Mood and affect normal.   BMET    Component Value Date/Time   NA 134 (L) 08/21/2017 1133   K 4.2 08/21/2017 1133   CL 98 (L) 08/21/2017 1133   CO2 27 08/21/2017 1133   GLUCOSE 85 08/21/2017 1133   BUN 14 08/21/2017 1133   CREATININE 0.91 08/21/2017 1133   CALCIUM 8.9 08/21/2017 1133   GFRNONAA 52 (L) 08/21/2017 1133   GFRAA 60 (L) 08/21/2017 1133    Lipid Panel  No results found for:  CHOL, TRIG, HDL, CHOLHDL, VLDL, LDLCALC  CBC    Component Value Date/Time   WBC 7.6 08/21/2017 1133   RBC 4.74 08/21/2017 1133   HGB 13.1 08/21/2017 1133   HCT 41.0 08/21/2017 1133   PLT 275 08/21/2017 1133   MCV 86.4 08/21/2017 1133   MCH 27.7 08/21/2017 1133   MCHC 32.1 08/21/2017 1133   RDW 14.4 08/21/2017 1133   LYMPHSABS 1.7 08/21/2017 1133   MONOABS 0.8 08/21/2017 1133   EOSABS 0.1 08/21/2017 1133   BASOSABS 0.0 08/21/2017 1133    Hgb A1C No results found for: HGBA1C         Assessment & Plan:

## 2017-12-05 NOTE — Assessment & Plan Note (Signed)
Controlled on Losartan HCT Check  BMET annually

## 2017-12-05 NOTE — Assessment & Plan Note (Signed)
Appreciate ALF care

## 2017-12-05 NOTE — Patient Instructions (Signed)
Vascular Dementia Dementia is a condition in which a person has problems with thinking, memory, and behavior that are severe enough to interfere with daily life. Vascular dementia is a type of dementia. It results from brain damage that is caused by the brain not getting enough blood. Vascular dementia usually begins between 60 and 82 years of age. What are the causes? Vascular dementia is caused by conditions that lessen blood flow to the brain. Common causes include:  Multiple small strokes. These may happen without symptoms (silent stroke).  Major stroke.  Damage to small blood vessels in the brain (cerebral small vessel disease).  What increases the risk?  Advancing age.  Having had a stroke.  Having high blood pressure (hypertension) or high cholesterol.  Having a disease that affects the heart or blood vessels.  Smoking.  Having diabetes.  Being female.  Being obese.  Not being active.  Having depression. What are the signs or symptoms? Symptoms can vary a lot from one person to another. Symptoms may be mild or severe depending on the amount of damage and which parts of the brain have been affected. Symptoms may begin suddenly or may develop gradually. Symptoms may remain stable, or they may get worse over time. Symptoms of vascular dementia may be similar to those of Alzheimer disease. The two conditions can occur together (mixed dementia). Symptoms of vascular dementia may include: Mental  Confusion.  Memory problems.  Poor attention and concentration.  Trouble understanding speech.  Depression.  Personality changes.  Trouble recognizing familiar people.  Agitation or aggression.  Paranoia.  Delusions or hallucinations. Physical  Weakness.  Poor balance.  Loss of bladder or bowel control (incontinence).  Unsteady walking (gait).  Speaking problems. Behavioral  Getting lost in familiar places.  Problems with planning and  judgment.  Trouble following instructions.  Social problems.  Emotional outbursts.  Trouble with daily activities and self-care.  Problems handling money. How is this diagnosed? There is not a specific test to diagnose vascular dementia. The health care provider will consider the person's medical history and symptoms or changes that are reported by friends and family. The health care provider will do a physical exam and may order lab tests or other tests that check brain and nervous system function. Tests that may be done include:  Blood tests.  Brain imaging tests.  Tests of movement, speech, and other daily activities (neurological exam).  Tests of memory, thinking, and problem-solving (neuropsychological or neurocognitive testing).  Diagnosis may involve several specialists. These may include a health care provider who specializes in the brain and nervous system (neurologist), a provider who specializes in disorders of the mind (psychiatrist), and a provider who focuses on speech and language changes (speech pathologist). How is this treated? There is no cure for vascular dementia. Brain damage that has already occurred cannot be reversed. Treatment depends on:  How severe the condition is.  Which parts of the brain have been affected.  The person's overall health.  Treatment measures aim to:  Treat the underlying cause of vascular dementia and manage risk factors. This may include: ? Controlling blood pressure. ? Lowering cholesterol. ? Treating diabetes. ? Quitting smoking. ? Losing weight.  Manage symptoms.  Prevent further brain damage.  Improve the person's health and quality of life.  Treatment for dementia may involve a team of health care providers, including:  A neurologist.  A psychiatrist.  An occupational therapist.  A speech pathologist.  A cardiologist.  An exercise physiologist or physical   therapist.  Follow these instructions at  home: Home care for a person with vascular dementia depends on what caused the condition and how severe the symptoms are. General guidelines for care at home include:  Following the health care provider's instructions for treating the condition that caused the dementia.  Using medicines only as told by the person's health care provider.  Creating a safe living space.  Learning ways to help the person remember people, appointments, and daily activities.  Finding a support group to help caregivers and family to cope with the effects of dementia.  Helping family and friends learn about ways to communicate with a person who has dementia.  Making sure the person keeps all follow-up visits and goes to all rehabilitation appointments as told by the health care team. This is important.  Contact a health care provider if:  A fever develops.  New behavioral problems develop.  Problems with swallowing develop.  Confusion gets worse.  Sleepiness gets worse. Get help right away if:  Loss of consciousness occurs.  There is a sudden loss of speech, balance, or thinking ability.  New numbness or paralysis occurs.  Sudden, severe headache occurs.  Vision is lost or suddenly gets worse in one or both eyes. This information is not intended to replace advice given to you by your health care provider. Make sure you discuss any questions you have with your health care provider. Document Released: 06/14/2002 Document Revised: 11/30/2015 Document Reviewed: 10/05/2014 Elsevier Interactive Patient Education  2018 [employer]  

## 2018-02-03 ENCOUNTER — Emergency Department
Admission: EM | Admit: 2018-02-03 | Discharge: 2018-02-03 | Disposition: A | Discharge status: Home / Self Care | Payer: Medicare Other | Attending: Emergency Medicine | Admitting: Emergency Medicine

## 2018-02-03 ENCOUNTER — Encounter: Payer: Self-pay | Admitting: Emergency Medicine

## 2018-02-03 ENCOUNTER — Other Ambulatory Visit: Payer: Self-pay

## 2018-02-03 ENCOUNTER — Emergency Department: Payer: Medicare Other

## 2018-02-03 DIAGNOSIS — Z79899 Other long term (current) drug therapy: Secondary | ICD-10-CM | POA: Insufficient documentation

## 2018-02-03 DIAGNOSIS — Z853 Personal history of malignant neoplasm of breast: Secondary | ICD-10-CM | POA: Insufficient documentation

## 2018-02-03 DIAGNOSIS — Z66 Do not resuscitate: Secondary | ICD-10-CM | POA: Diagnosis not present

## 2018-02-03 DIAGNOSIS — E860 Dehydration: Secondary | ICD-10-CM

## 2018-02-03 DIAGNOSIS — R55 Syncope and collapse: Secondary | ICD-10-CM | POA: Insufficient documentation

## 2018-02-03 DIAGNOSIS — I10 Essential (primary) hypertension: Secondary | ICD-10-CM | POA: Diagnosis not present

## 2018-02-03 LAB — COMPREHENSIVE METABOLIC PANEL
ALT: 12 U/L (ref 0–44)
AST: 25 U/L (ref 15–41)
Albumin: 3.7 g/dL (ref 3.5–5.0)
Alkaline Phosphatase: 61 U/L (ref 38–126)
Anion gap: 7 (ref 5–15)
BUN: 14 mg/dL (ref 8–23)
CO2: 26 mmol/L (ref 22–32)
Calcium: 8.6 mg/dL — ABNORMAL LOW (ref 8.9–10.3)
Chloride: 97 mmol/L — ABNORMAL LOW (ref 98–111)
Creatinine, Ser: 0.58 mg/dL (ref 0.44–1.00)
GFR calc Af Amer: >60 mL/min (ref >60)
GFR calc non Af Amer: >60 mL/min (ref >60)
Glucose, Bld: 154 mg/dL — ABNORMAL HIGH (ref 70–99)
Potassium: 3.9 mmol/L (ref 3.5–5.1)
Sodium: 130 mmol/L — ABNORMAL LOW (ref 135–145)
Total Bilirubin: 0.7 mg/dL (ref 0.3–1.2)
Total Protein: 5.9 g/dL — ABNORMAL LOW (ref 6.5–8.1)

## 2018-02-03 LAB — CBC WITH DIFFERENTIAL/PLATELET
Basophils Absolute: 0.1 10*3/uL (ref 0–0.1)
Basophils Relative: 1 %
Eosinophils Absolute: 0 10*3/uL (ref 0–0.7)
Eosinophils Relative: 0 %
HCT: 39.2 % (ref 35.0–47.0)
Hemoglobin: 13.1 g/dL (ref 12.0–16.0)
Lymphocytes Relative: 16 %
Lymphs Abs: 1.3 10*3/uL (ref 1.0–3.6)
MCH: 28.3 pg (ref 26.0–34.0)
MCHC: 33.5 g/dL (ref 32.0–36.0)
MCV: 84.7 fL (ref 80.0–100.0)
Monocytes Absolute: 0.8 10*3/uL (ref 0.2–0.9)
Monocytes Relative: 10 %
Neutro Abs: 5.8 10*3/uL (ref 1.4–6.5)
Neutrophils Relative %: 73 %
Platelets: 269 10*3/uL (ref 150–440)
RBC: 4.63 MIL/uL (ref 3.80–5.20)
RDW: 14.3 % (ref 11.5–14.5)
WBC: 8.1 10*3/uL (ref 3.6–11.0)

## 2018-02-03 LAB — URINALYSIS, COMPLETE (UACMP) WITH MICROSCOPIC
Bacteria, UA: NONE SEEN
Bilirubin Urine: NEGATIVE
Glucose, UA: 50 mg/dL — AB
Hgb urine dipstick: NEGATIVE
Ketones, ur: NEGATIVE mg/dL
Nitrite: NEGATIVE
Protein, ur: NEGATIVE mg/dL
Specific Gravity, Urine: 1.015 (ref 1.005–1.030)
pH: 7 (ref 5.0–8.0)

## 2018-02-03 LAB — GLUCOSE, CAPILLARY: Glucose-Capillary: 164 mg/dL — ABNORMAL HIGH (ref 70–99)

## 2018-02-03 LAB — TROPONIN I: Troponin I: <0.03 ng/mL (ref <0.03)

## 2018-02-03 MED ORDER — SODIUM CHLORIDE 0.9 % IV BOLUS
500.0000 mL | Freq: Once | INTRAVENOUS | Status: AC
  Administered 2018-02-03: 500 mL via INTRAVENOUS

## 2018-02-03 NOTE — ED Triage Notes (Signed)
Pt brought in via [facility] from [facility]. [facility] reported to EMS that pt was walking down the steps at lunch and had near syncopal episode, but was caught before hitting the ground. EMS reported that pt was pale when they arrived and complained of generalized weakness. EMS reported that fluids were started in route, but only about 200 ccs infused. EMS reported pt was normal sinus rhythm with PACs and peaked t-waves. Pt has hx of dementia, HTN, breast cancer and urinary incontinence. DNR on file from facility. [facility] facility member in lobby during triage until pt is settled.

## 2018-02-03 NOTE — ED Notes (Signed)
Patient transported to X-ray 

## 2018-02-03 NOTE — ED Provider Notes (Addendum)
[facility] Emergency Department Provider Note  ____________________________________________   I have reviewed the triage vital signs and the nursing notes. Where available I have reviewed prior notes and, if possible and indicated, outside hospital notes.    HISTORY  Chief Complaint Near Syncope and Weakness    HPI Robin Lynn is a 84 y.o. female  Who is DNR presents from a nursing facility, according to EMS, she was walking and felt lightheaded did not fall, normal vitals normal glucose reassuring EKG on the way in.  She has no complaints.  Did not fall. She states she feels "pretty good".  Level 5 chart caveat; no further history available due to patient status.  Past Medical History:  Diagnosis Date  . Cancer ([place])   . History of breast cancer    left  . HLD (hyperlipidemia)   . HTN (hypertension)   . Urinary incontinence     Patient Active Problem List   Diagnosis Date Noted  . History of breast cancer 12/05/2017  . Advance directive discussed with patient 08/21/2016  . Vascular dementia without behavioral disturbance 02/14/2016  . HTN (hypertension) 03/23/2015  . HLD (hyperlipidemia) 03/23/2015    Past Surgical History:  Procedure Laterality Date  . APPENDECTOMY    . BREAST LUMPECTOMY Left    with radiation    Prior to Admission medications   Medication Sig Start Date End Date Taking? Authorizing Provider  acetaminophen (TYLENOL) 325 MG tablet Take 325-650 mg by mouth every 4 (four) hours as needed.    [provider]  alum & mag hydroxide-simeth (MAALOX/MYLANTA) 200-200-20 MG/5ML suspension Take 30 mLs by mouth every 4 (four) hours as needed for indigestion or heartburn.    [provider]  bismuth subsalicylate (KAOPECTATE) 262 MG/15ML suspension Take 10 mLs by mouth 3 (three) times daily as needed.    [provider]  carbamide peroxide (DEBROX) 6.5 % OTIC solution 5 drops daily as needed. For 5 days     [provider]  guaiFENesin-dextromethorphan (ROBITUSSIN DM) 100-10 MG/5ML syrup Take 10 mLs by mouth every 4 (four) hours as needed for cough.    [provider]  losartan-hydrochlorothiazide (HYZAAR) 50-12.5 MG tablet TAKE 1 TABLET BY MOUTH ONCE DAILY FOR BP 05/16/17   Viviana Simpler I, MD  magnesium hydroxide (MILK OF MAGNESIA) 400 MG/5ML suspension Take 30 mLs by mouth daily as needed for mild constipation.    [provider]  nystatin (MYCOSTATIN/NYSTOP) powder Apply 1 g topically 2 (two) times daily. To folds and under the breasts    [provider]  therapeutic multivitamin-minerals (THERAGRAN-M) tablet Take 1 tablet by mouth daily.    [provider]  Vitamin D, Cholecalciferol, 1000 units CAPS Take by mouth.    [provider]  vitamin E 400 UNIT capsule Take 400 Units by mouth daily.    [provider]    Allergies Patient has no known allergies.  Family History  Problem Relation Age of Onset  . CAD Father   . Heart attack Father   . Cancer Unknown   . CAD Sister   . Diabetes Neg Hx     Social History Social History   Tobacco Use  . Smoking status: Never Smoker  . Smokeless tobacco: Never Used  Substance Use Topics  . Alcohol use: Yes    Alcohol/week: 0.6 oz    Types: 1 Glasses of wine per week  . Drug use: No    Review of Systems, this is  the answers I get from the patient, however, cannot attest to their accuracy given history of dementia Constitutional: No fever/chills Eyes: No visual changes. ENT: No sore throat. No stiff neck no neck pain Cardiovascular: Denies chest pain. Respiratory: Denies shortness of breath. Gastrointestinal:   no vomiting.  No diarrhea.  No constipation. Genitourinary: Negative for dysuria. Musculoskeletal: Negative lower extremity swelling Skin: Negative for rash. Neurological: Negative for severe headaches, focal weakness or  numbness.   ____________________________________________   PHYSICAL EXAM:  VITAL SIGNS: ED Triage Vitals  Enc Vitals Group     BP 02/03/18 1303 (!) 157/61     Pulse Rate 02/03/18 1303 76     Resp 02/03/18 1303 17     Temp 02/03/18 1303 97.6 F (36.4 C)     Temp Source 02/03/18 1303 Oral     SpO2 02/03/18 1246 100 %     Weight 02/03/18 1305 140 lb 14.4 oz (63.9 kg)     Height 02/03/18 1305 5\' 5"  (1.651 m)     Head Circumference --      Peak Flow --      Pain Score 02/03/18 1307 0     Pain Loc --      Pain Edu? --      Excl. in [place]? --     Constitutional: Alert and oriented to name and place unsure of the date. Well appearing and in no acute distress. Eyes: Conjunctivae are normal Head: Atraumatic HEENT: No congestion/rhinnorhea. Mucous membranes are moist.  Oropharynx non-erythematous Neck:   Nontender with no meningismus, no masses, no stridor Cardiovascular: Normal rate, regular rhythm. Grossly normal heart sounds.  Good peripheral circulation. Respiratory: Normal respiratory effort.  No retractions. Lungs CTAB. Abdominal: Soft and nontender. No distention. No guarding no rebound Back:  There is no focal tenderness or step off.  there is no midline tenderness there are no lesions noted. there is no CVA tenderness Musculoskeletal: No lower extremity tenderness, no upper extremity tenderness. No joint effusions, no DVT signs strong distal pulses no edema Neurologic:  Normal speech and language. No gross focal neurologic deficits are appreciated.  Skin:  Skin is warm, dry and intact. No rash noted. Psychiatric: Mood and affect are normal. Speech and behavior are normal.  ____________________________________________   LABS (all labs ordered are listed, but only abnormal results are displayed)  Labs Reviewed  GLUCOSE, CAPILLARY - Abnormal; Notable for the following components:      Result Value   Glucose-Capillary 164 (*)    All other components within normal limits   COMPREHENSIVE METABOLIC PANEL  CBC WITH DIFFERENTIAL/PLATELET  TROPONIN I  URINALYSIS, COMPLETE (UACMP) WITH MICROSCOPIC    Pertinent labs  results that were available during my care of the patient were reviewed by me and considered in my medical decision making (see chart for details). ____________________________________________  EKG  I personally interpreted any EKGs ordered by me or triage Sinus rhythm, rate 66 bpm no acute ST elevation or depression normal axis ____________________________________________  RADIOLOGY  Pertinent labs & imaging results that were available during my care of the patient were reviewed by me and considered in my medical decision making (see chart for details). If possible, patient and/or family made aware of any abnormal findings.  No results found. ____________________________________________    PROCEDURES  Procedure(s) performed: None  Procedures  Critical Care performed: None  ____________________________________________   INITIAL IMPRESSION / ASSESSMENT AND PLAN / ED COURSE  Pertinent labs & imaging results that were available during my  care of the patient were reviewed by me and considered in my medical decision making (see chart for details).  Patient apparently almost fell, no evidence of trauma, she has no complaints, will check basic work-up to make sure nothing else is a mass, patient is DNR, has no complaints.  No evidence of head trauma nothing a CT is indicated.  ----------------------------------------- 2:42 PM on 02/03/2018 -----------------------------------------  Here, work-up was quite unremarkable here, sodium is slightly low, I did suggest giving some IV fluid and family are fine with that after that they like to take her home.  She is at her baseline with no complaints I do not feel further intervention is needed.  Patient does have a somewhat equivocal UA, we are sending urine culture.  Patient is in no acute  distress and completely back to how she normally is here.  Family understands limitations of work-up and very comfortable with discharge after fluids    ____________________________________________   FINAL CLINICAL IMPRESSION(S) / ED DIAGNOSES  Final diagnoses:  None      This chart was dictated using voice recognition software.  Despite best efforts to proofread,  errors can occur which can change meaning.      Schuyler Amor, MD 02/03/18 1315    Schuyler Amor, MD 02/03/18 [phone]

## 2018-02-03 NOTE — Discharge Instructions (Signed)
Sodium is slightly low at 130, please have your doctor recheck it, drink plenty of fluids including fluids that contain electrolytes or supplements such as boost or Ensure, and return to the emergency room for new or worrisome symptoms

## 2018-02-04 LAB — URINE CULTURE: Culture: NO GROWTH

## 2018-02-06 ENCOUNTER — Observation Stay
Admission: EM | Admit: 2018-02-06 | Discharge: 2018-02-08 | Disposition: A | Discharge status: Skilled Nursing Facility | Payer: Medicare Other | Attending: Internal Medicine | Admitting: Internal Medicine

## 2018-02-06 ENCOUNTER — Other Ambulatory Visit: Payer: Self-pay

## 2018-02-06 ENCOUNTER — Emergency Department: Payer: Medicare Other

## 2018-02-06 DIAGNOSIS — Z853 Personal history of malignant neoplasm of breast: Secondary | ICD-10-CM | POA: Insufficient documentation

## 2018-02-06 DIAGNOSIS — F329 Major depressive disorder, single episode, unspecified: Secondary | ICD-10-CM | POA: Insufficient documentation

## 2018-02-06 DIAGNOSIS — I10 Essential (primary) hypertension: Secondary | ICD-10-CM | POA: Diagnosis not present

## 2018-02-06 DIAGNOSIS — E871 Hypo-osmolality and hyponatremia: Secondary | ICD-10-CM | POA: Insufficient documentation

## 2018-02-06 DIAGNOSIS — R270 Ataxia, unspecified: Secondary | ICD-10-CM | POA: Diagnosis not present

## 2018-02-06 DIAGNOSIS — F0390 Unspecified dementia without behavioral disturbance: Secondary | ICD-10-CM | POA: Insufficient documentation

## 2018-02-06 DIAGNOSIS — Z8249 Family history of ischemic heart disease and other diseases of the circulatory system: Secondary | ICD-10-CM | POA: Insufficient documentation

## 2018-02-06 DIAGNOSIS — R011 Cardiac murmur, unspecified: Secondary | ICD-10-CM | POA: Insufficient documentation

## 2018-02-06 DIAGNOSIS — R7989 Other specified abnormal findings of blood chemistry: Secondary | ICD-10-CM | POA: Diagnosis not present

## 2018-02-06 DIAGNOSIS — R55 Syncope and collapse: Principal | ICD-10-CM | POA: Insufficient documentation

## 2018-02-06 DIAGNOSIS — Z66 Do not resuscitate: Secondary | ICD-10-CM | POA: Insufficient documentation

## 2018-02-06 DIAGNOSIS — Z79899 Other long term (current) drug therapy: Secondary | ICD-10-CM | POA: Insufficient documentation

## 2018-02-06 DIAGNOSIS — R32 Unspecified urinary incontinence: Secondary | ICD-10-CM | POA: Diagnosis not present

## 2018-02-06 LAB — CBC
HCT: 36.8 % (ref 35.0–47.0)
Hemoglobin: 12.9 g/dL (ref 12.0–16.0)
MCH: 29.1 pg (ref 26.0–34.0)
MCHC: 34.9 g/dL (ref 32.0–36.0)
MCV: 83.4 fL (ref 80.0–100.0)
Platelets: 230 10*3/uL (ref 150–440)
RBC: 4.41 MIL/uL (ref 3.80–5.20)
RDW: 13.9 % (ref 11.5–14.5)
WBC: 7 10*3/uL (ref 3.6–11.0)

## 2018-02-06 LAB — URINALYSIS, COMPLETE (UACMP) WITH MICROSCOPIC
Bacteria, UA: NONE SEEN
Bilirubin Urine: NEGATIVE
Glucose, UA: NEGATIVE mg/dL
Hgb urine dipstick: NEGATIVE
Ketones, ur: NEGATIVE mg/dL
Leukocytes, UA: NEGATIVE
Nitrite: NEGATIVE
Protein, ur: NEGATIVE mg/dL
Specific Gravity, Urine: 1.01 (ref 1.005–1.030)
pH: 7 (ref 5.0–8.0)

## 2018-02-06 LAB — BASIC METABOLIC PANEL
Anion gap: 8 (ref 5–15)
BUN: 11 mg/dL (ref 8–23)
CO2: 26 mmol/L (ref 22–32)
Calcium: 8.5 mg/dL — ABNORMAL LOW (ref 8.9–10.3)
Chloride: 95 mmol/L — ABNORMAL LOW (ref 98–111)
Creatinine, Ser: 0.9 mg/dL (ref 0.44–1.00)
GFR calc Af Amer: >60 mL/min (ref >60)
GFR calc non Af Amer: 52 mL/min — ABNORMAL LOW (ref >60)
Glucose, Bld: 122 mg/dL — ABNORMAL HIGH (ref 70–99)
Potassium: 3.9 mmol/L (ref 3.5–5.1)
Sodium: 129 mmol/L — ABNORMAL LOW (ref 135–145)

## 2018-02-06 LAB — SODIUM, URINE, RANDOM: Sodium, Ur: 85 mmol/L

## 2018-02-06 LAB — TROPONIN I
Troponin I: <0.03 ng/mL (ref <0.03)
Troponin I: 0.06 ng/mL (ref <0.03)

## 2018-02-06 LAB — CREATININE, URINE, RANDOM: Creatinine, Urine: 35 mg/dL

## 2018-02-06 LAB — TSH: TSH: 5.798 u[IU]/mL — ABNORMAL HIGH (ref 0.350–4.500)

## 2018-02-06 MED ORDER — ESCITALOPRAM OXALATE 10 MG PO TABS
10.0000 mg | ORAL_TABLET | Freq: Every day | ORAL | Status: DC
  Administered 2018-02-06 – 2018-02-08 (×3): 10 mg via ORAL
  Filled 2018-02-06 (×3): qty 1

## 2018-02-06 MED ORDER — SODIUM CHLORIDE 0.9 % IV SOLN
INTRAVENOUS | Status: DC
  Administered 2018-02-06 – 2018-02-07 (×3): via INTRAVENOUS

## 2018-02-06 MED ORDER — SODIUM CHLORIDE 0.9 % IV BOLUS
500.0000 mL | Freq: Once | INTRAVENOUS | Status: AC
  Administered 2018-02-06: 500 mL via INTRAVENOUS

## 2018-02-06 MED ORDER — ADULT MULTIVITAMIN W/MINERALS CH
1.0000 | ORAL_TABLET | Freq: Every day | ORAL | Status: DC
  Administered 2018-02-06 – 2018-02-08 (×3): 1 via ORAL
  Filled 2018-02-06 (×4): qty 1

## 2018-02-06 MED ORDER — GUAIFENESIN-DM 100-10 MG/5ML PO SYRP
10.0000 mL | ORAL_SOLUTION | ORAL | Status: DC | PRN
  Administered 2018-02-06: 10 mL via ORAL
  Filled 2018-02-06 (×2): qty 10

## 2018-02-06 MED ORDER — ONDANSETRON HCL 4 MG/2ML IJ SOLN: 4.0000 mg | Freq: Four times a day (QID) | INTRAMUSCULAR | Status: DC | PRN

## 2018-02-06 MED ORDER — LOSARTAN POTASSIUM 50 MG PO TABS
50.0000 mg | ORAL_TABLET | Freq: Every day | ORAL | Status: DC
  Administered 2018-02-07: 50 mg via ORAL
  Filled 2018-02-06: qty 1

## 2018-02-06 MED ORDER — HYDRALAZINE HCL 20 MG/ML IJ SOLN
10.0000 mg | INTRAMUSCULAR | Status: DC | PRN
  Administered 2018-02-06: 18:00:00 10 mg via INTRAVENOUS
  Filled 2018-02-06: qty 1

## 2018-02-06 MED ORDER — ENOXAPARIN SODIUM 40 MG/0.4ML ~~LOC~~ SOLN
40.0000 mg | SUBCUTANEOUS | Status: DC
  Administered 2018-02-06: 18:00:00 40 mg via SUBCUTANEOUS
  Filled 2018-02-06: qty 0.4

## 2018-02-06 MED ORDER — ACETAMINOPHEN 325 MG PO TABS
650.0000 mg | ORAL_TABLET | Freq: Four times a day (QID) | ORAL | Status: DC | PRN
  Administered 2018-02-06: 650 mg via ORAL
  Filled 2018-02-06: qty 2

## 2018-02-06 MED ORDER — LOSARTAN POTASSIUM-HCTZ 50-12.5 MG PO TABS: 1.0000 | ORAL_TABLET | Freq: Every day | ORAL | Status: DC

## 2018-02-06 MED ORDER — ACETAMINOPHEN 650 MG RE SUPP: 650.0000 mg | Freq: Four times a day (QID) | RECTAL | Status: DC | PRN

## 2018-02-06 MED ORDER — POLYETHYLENE GLYCOL 3350 17 G PO PACK: 17.0000 g | PACK | Freq: Every day | ORAL | Status: DC | PRN

## 2018-02-06 MED ORDER — HYDROCODONE-ACETAMINOPHEN 5-325 MG PO TABS
1.0000 | ORAL_TABLET | ORAL | Status: DC | PRN
  Administered 2018-02-07: 20:00:00 2 via ORAL
  Filled 2018-02-06: qty 2

## 2018-02-06 MED ORDER — NYSTATIN 100000 UNIT/GM EX POWD
1.0000 g | Freq: Two times a day (BID) | CUTANEOUS | Status: DC
  Administered 2018-02-06 – 2018-02-08 (×4): 1 g via TOPICAL
  Filled 2018-02-06: qty 15

## 2018-02-06 MED ORDER — HYDRALAZINE HCL 20 MG/ML IJ SOLN: 10.0000 mg | Freq: Four times a day (QID) | INTRAMUSCULAR | Status: DC | PRN

## 2018-02-06 MED ORDER — HYDROCHLOROTHIAZIDE 12.5 MG PO CAPS: 12.5000 mg | ORAL_CAPSULE | Freq: Every day | ORAL | Status: DC

## 2018-02-06 MED ORDER — ONDANSETRON HCL 4 MG PO TABS: 4.0000 mg | ORAL_TABLET | Freq: Four times a day (QID) | ORAL | Status: DC | PRN

## 2018-02-06 NOTE — ED Notes (Signed)
Pt assisted on bedpan, voided 300 ml

## 2018-02-06 NOTE — Progress Notes (Signed)
MD Duane Boston paged and made her aware of pt Troponin  level of 0.06, no orders given because another Troponin due In 6 hours. Will continue to monitor to prevent injuries/falls.

## 2018-02-06 NOTE — ED Notes (Signed)
daughter is HCP and number is [phone], wanting to be updated as things progress. RN Janett Billow is aware.

## 2018-02-06 NOTE — NC FL2 (Addendum)
[place] LEVEL OF CARE SCREENING TOOL     IDENTIFICATION  Patient Name: Robin Lynn Birthdate: 01-01-1919 Sex: female Admission Date (Current Location): 02/06/2018  [place] and [place] Number:  Engineering geologist and Address:  [facility], [address] [place], [place] [address]      Provider Number: [id number]  Attending Physician Name and Address:  Gorden Harms, MD  Relative Name and Phone Number:       Current Level of Care: Hospital Recommended Level of Care: Assisted Living Facility([facility]) Prior Approval Number:    Date Approved/Denied:   PASRR Number:    Discharge Plan: Other (Comment)([place]/ as per Burnett Sheng)    Current Diagnoses: Patient Active Problem List   Diagnosis Date Noted  . Syncope and collapse 02/06/2018  . History of breast cancer 12/05/2017  . Advance directive discussed with patient 08/21/2016  . Vascular dementia without behavioral disturbance 02/14/2016  . HTN (hypertension) 03/23/2015  . HLD (hyperlipidemia) 03/23/2015    Orientation RESPIRATION BLADDER Height & Weight     Self, Situation, Place  Normal Continent Weight: 140 lb (63.5 kg) Height:  5\' 5"  (165.1 cm)  BEHAVIORAL SYMPTOMS/MOOD NEUROLOGICAL BOWEL NUTRITION STATUS      Continent Diet(Normal)  AMBULATORY STATUS COMMUNICATION OF NEEDS Skin   Independent Verbally Normal                       Personal Care Assistance Level of Assistance  Bathing, Feeding, Dressing, Total care Bathing Assistance: Limited assistance Feeding assistance: Independent Dressing Assistance: Independent Total Care Assistance: Limited assistance   Functional Limitations Info  Sight, Hearing, Speech Sight Info: Adequate Hearing Info: (S) Impaired(Hard of hearing) Speech Info: Adequate    SPECIAL CARE FACTORS FREQUENCY                       Contractures Contractures Info: Not present    Additional Factors Info  Code  Status Code Status Info: DNR             Current Medications (02/06/2018):  This is the current hospital active medication list No current facility-administered medications for this encounter.    Current Outpatient Medications  Medication Sig Dispense Refill  . acetaminophen (TYLENOL) 325 MG tablet Take 325-650 mg by mouth every 4 (four) hours as needed.    Marland Kitchen alum & mag hydroxide-simeth (MAALOX/MYLANTA) 200-200-20 MG/5ML suspension Take 30 mLs by mouth every 4 (four) hours as needed for indigestion or heartburn.    . bismuth subsalicylate (KAOPECTATE) 262 MG/15ML suspension Take 10 mLs by mouth 3 (three) times daily as needed.    Marland Kitchen co-enzyme Q-10 30 MG capsule Take 1 capsule by mouth daily.    Marland Kitchen escitalopram (LEXAPRO) 10 MG tablet Take 10 mg by mouth daily.    Marland Kitchen guaiFENesin-dextromethorphan (ROBITUSSIN DM) 100-10 MG/5ML syrup Take 10 mLs by mouth every 4 (four) hours as needed for cough.    . losartan-hydrochlorothiazide (HYZAAR) 50-12.5 MG tablet TAKE 1 TABLET BY MOUTH ONCE DAILY FOR BP 30 tablet 11  . magnesium hydroxide (MILK OF MAGNESIA) 400 MG/5ML suspension Take 30 mLs by mouth daily as needed for mild constipation.    Marland Kitchen nystatin (MYCOSTATIN/NYSTOP) powder Apply 1 g topically 2 (two) times daily. To folds and under the breasts    . therapeutic multivitamin-minerals (THERAGRAN-M) tablet Take 1 tablet by mouth daily.    . Vitamin D, Cholecalciferol, 1000 units CAPS Take by mouth.    Marland Kitchen  vitamin E 400 UNIT capsule Take 400 Units by mouth daily.       Discharge Medications: Please see discharge summary for a list of discharge medications.  Relevant Imaging Results:  Relevant Lab Results:   Additional Information SSN [id number]  [place], [place], [place]

## 2018-02-06 NOTE — H&P (Signed)
[place] at [place] NAME: Robin Lynn    MR#:  [id number]  DATE OF BIRTH:  [birth year]  DATE OF ADMISSION:  02/06/2018  PRIMARY CARE PHYSICIAN: Venia Carbon, MD   REQUESTING/REFERRING PHYSICIAN:   CHIEF COMPLAINT:   Chief Complaint  Patient presents with  . Loss of Consciousness    HISTORY OF PRESENT ILLNESS: Robin Lynn  is a 84 y.o. female with a known history per below presenting from assisted living facility with acute syncope with collapse, patient slumped over in her chair at her facility, was found to have decreased level of consciousness, in the emergency room sodium was 129, chloride 95, CT head negative, urinalysis negative, EKG benign, TSH normal, CBC normal, patient evaluated emergency room, facility worker present in the room, patient denies any pain/shortness of breath, patient cannot recall the events that brought her to the emergency room and is a poor historian for that reason, patient otherwise oriented to place/president, does not know the year, patient now being admitted for acute syncope with collapse, hyponatremia, hypochloremia.  PAST MEDICAL HISTORY:   Past Medical History:  Diagnosis Date  . Cancer ([place])   . History of breast cancer    left  . HLD (hyperlipidemia)   . HTN (hypertension)   . Urinary incontinence     PAST SURGICAL HISTORY:  Past Surgical History:  Procedure Laterality Date  . APPENDECTOMY    . BREAST LUMPECTOMY Left    with radiation    SOCIAL HISTORY:  Social History   Tobacco Use  . Smoking status: Never Smoker  . Smokeless tobacco: Never Used  Substance Use Topics  . Alcohol use: Yes    Alcohol/week: 0.6 oz    Types: 1 Glasses of wine per week    FAMILY HISTORY:  Family History  Problem Relation Age of Onset  . CAD Father   . Heart attack Father   . Cancer Unknown   . CAD Sister   . Diabetes Neg Hx     DRUG ALLERGIES: No Known Allergies  REVIEW OF SYSTEMS: Poor  historian due to retrograde amnesia  CONSTITUTIONAL: No fever, fatigue or weakness.  EYES: No blurred or double vision.  EARS, NOSE, AND THROAT: No tinnitus or ear pain.  RESPIRATORY: No cough, shortness of breath, wheezing or hemoptysis.  CARDIOVASCULAR: No chest pain, orthopnea, edema.  GASTROINTESTINAL: No nausea, vomiting, diarrhea or abdominal pain.  GENITOURINARY: No dysuria, hematuria.  ENDOCRINE: No polyuria, nocturia,  HEMATOLOGY: No anemia, easy bruising or bleeding SKIN: No rash or lesion. MUSCULOSKELETAL: No joint pain or arthritis.   NEUROLOGIC: No tingling, numbness, weakness.  PSYCHIATRY: No anxiety or depression.   MEDICATIONS AT HOME:  Prior to Admission medications   Medication Sig Start Date End Date Taking? Authorizing Provider  acetaminophen (TYLENOL) 325 MG tablet Take 325-650 mg by mouth every 4 (four) hours as needed.   Yes [provider]  alum & mag hydroxide-simeth (MAALOX/MYLANTA) 200-200-20 MG/5ML suspension Take 30 mLs by mouth every 4 (four) hours as needed for indigestion or heartburn.   Yes [provider]  bismuth subsalicylate (KAOPECTATE) 262 MG/15ML suspension Take 10 mLs by mouth 3 (three) times daily as needed.   Yes [provider]  co-enzyme Q-10 30 MG capsule Take 1 capsule by mouth daily.   Yes [provider]  escitalopram (LEXAPRO) 10 MG tablet Take 10 mg by mouth daily.   Yes [provider]  guaiFENesin-dextromethorphan (ROBITUSSIN DM) 100-10 MG/5ML syrup Take  10 mLs by mouth every 4 (four) hours as needed for cough.   Yes [provider]  losartan-hydrochlorothiazide (HYZAAR) 50-12.5 MG tablet TAKE 1 TABLET BY MOUTH ONCE DAILY FOR BP 05/16/17  Yes Viviana Simpler I, MD  magnesium hydroxide (MILK OF MAGNESIA) 400 MG/5ML suspension Take 30 mLs by mouth daily as needed for mild constipation.   Yes [provider]  nystatin (MYCOSTATIN/NYSTOP) powder Apply 1 g topically 2 (two) times  daily. To folds and under the breasts   Yes [provider]  therapeutic multivitamin-minerals (THERAGRAN-M) tablet Take 1 tablet by mouth daily.   Yes [provider]  Vitamin D, Cholecalciferol, 1000 units CAPS Take by mouth.   Yes [provider]  vitamin E 400 UNIT capsule Take 400 Units by mouth daily.   Yes [provider]      PHYSICAL EXAMINATION:   VITAL SIGNS: Blood pressure 127/61, pulse 77, temperature 98.2 F (36.8 C), temperature source Oral, resp. rate 14, height 5\' 5"  (1.651 m), weight 63.5 kg (140 lb), SpO2 99 %.  GENERAL:  84 y.o.-year-old patient lying in the bed with no acute distress.  Frail-appearing EYES: Pupils equal, round, reactive to light and accommodation. No scleral icterus. Extraocular muscles intact.  HEENT: Head atraumatic, normocephalic. Oropharynx and nasopharynx clear.  NECK:  Supple, no jugular venous distention. No thyroid enlargement, no tenderness.  LUNGS: Normal breath sounds bilaterally, no wheezing, rales,rhonchi or crepitation. No use of accessory muscles of respiration.  CARDIOVASCULAR: S1, S2 normal. SEM, no rubs, or gallops.  ABDOMEN: Soft, nontender, nondistended. Bowel sounds present. No organomegaly or mass.  EXTREMITIES: No pedal edema, cyanosis, or clubbing.  NEUROLOGIC: Cranial nerves II through XII are intact. Muscle strength 5/5 in all extremities. Sensation intact. Gait not checked.  PSYCHIATRIC: The patient is alert and oriented x 2, did not know year/refused to gas.  SKIN: No obvious rash, lesion, or ulcer.   LABORATORY PANEL:   CBC Recent Labs  Lab 02/03/18 1307 02/06/18 1038  WBC 8.1 7.0  HGB 13.1 12.9  HCT 39.2 36.8  PLT 269 230  MCV 84.7 83.4  MCH 28.3 29.1  MCHC 33.5 34.9  RDW 14.3 13.9  LYMPHSABS 1.3  --   MONOABS 0.8  --   EOSABS 0.0  --   BASOSABS 0.1  --     ------------------------------------------------------------------------------------------------------------------  Chemistries  Recent Labs  Lab 02/03/18 1307 02/06/18 1038  NA 130* 129*  K 3.9 3.9  CL 97* 95*  CO2 26 26  GLUCOSE 154* 122*  BUN 14 11  CREATININE 0.58 0.90  CALCIUM 8.6* 8.5*  AST 25  --   ALT 12  --   ALKPHOS 61  --   BILITOT 0.7  --    ------------------------------------------------------------------------------------------------------------------ estimated creatinine clearance is 32.9 mL/min (by C-G formula based on SCr of 0.9 mg/dL). ------------------------------------------------------------------------------------------------------------------ Recent Labs    02/06/18 1038  TSH 5.798*     Coagulation profile No results for input(s): INR, PROTIME in the last 168 hours. ------------------------------------------------------------------------------------------------------------------- No results for input(s): DDIMER in the last 72 hours. -------------------------------------------------------------------------------------------------------------------  Cardiac Enzymes Recent Labs  Lab 02/03/18 1307  TROPONINI <0.03   ------------------------------------------------------------------------------------------------------------------ Invalid input(s): POCBNP  ---------------------------------------------------------------------------------------------------------------  Urinalysis    Component Value Date/Time   COLORURINE YELLOW (A) 02/06/2018 1038   APPEARANCEUR HAZY (A) 02/06/2018 1038   LABSPEC 1.010 02/06/2018 1038   PHURINE 7.0 02/06/2018 1038   GLUCOSEU NEGATIVE 02/06/2018 1038   HGBUR NEGATIVE 02/06/2018 1038   [place] 02/06/2018 1038  KETONESUR NEGATIVE 02/06/2018 1038   PROTEINUR NEGATIVE 02/06/2018 1038   NITRITE NEGATIVE 02/06/2018 1038   LEUKOCYTESUR NEGATIVE 02/06/2018 1038     RADIOLOGY: Ct Head Wo  Contrast  Result Date: 02/06/2018 CLINICAL DATA:  Decreased level of consciousness EXAM: CT HEAD WITHOUT CONTRAST TECHNIQUE: Contiguous axial images were obtained from the base of the skull through the vertex without intravenous contrast. COMPARISON:  08/21/2017 FINDINGS: Brain: Diffuse atrophic changes and chronic white matter ischemic changes are seen. The overall appearance is stable from the prior exam. No acute hemorrhage, acute infarction or space-occupying mass lesion is seen. Vascular: No hyperdense vessel or unexpected calcification. Skull: Normal. Negative for fracture or focal lesion. Sinuses/Orbits: No acute finding. Other: None. IMPRESSION: Chronic white matter ischemic changes and atrophic changes without acute abnormality Electronically Signed   By: Inez Catalina M.D.   On: 02/06/2018 12:12    EKG: Orders placed or performed during the hospital encounter of 02/06/18  . EKG 12-Lead  . EKG 12-Lead  . ED EKG  . ED EKG  . EKG 12-Lead  . EKG 12-Lead    IMPRESSION AND PLAN: *Acute syncope with collapse Exact etiology is unknown Admit to regular nursing for bed, IV fluids for gentle rehydration echocardiogram, carotid Dopplers, orthostatics, physical therapy to evaluate/treat, rule out acute coronary syndrome with cardiac enzymes x3 sets, neurovascular checks per routine fall precautions close medical monitoring  *Acute systolic ejection murmur Follow-up on echocardiogram  *Acute hyponatremia/hypochloremia Replete with IV fluids rehydration, BMP in the morning  *Chronic benign essential hypertension Stable Continue losartan/hydrochlorothiazide, PRN hydralazine, vitals per routine, make changes as per necessary  *Chronic ataxia Physical therapy to see, uses walker with ambulation    All the records are reviewed and case discussed with ED provider. Management plans discussed with the patient, family and they are in agreement.  CODE STATUS:full Code Status History    Date  Active Date Inactive Code Status Order ID Comments User Context   03/23/2015 2223 03/24/2015 1607 DNR [id number]  Lance Coon, MD Inpatient    Questions for Most Recent Historical Code Status (Order [id number])    Question Answer Comment   In the event of cardiac or respiratory ARREST Do not call a "code blue"    In the event of cardiac or respiratory ARREST Do not perform Intubation, CPR, defibrillation or ACLS    In the event of cardiac or respiratory ARREST Use medication by any route, position, wound care, and other measures to relive pain and suffering. May use oxygen, suction and manual treatment of airway obstruction as needed for comfort.        TOTAL TIME TAKING CARE OF THIS PATIENT: 45 minutes.    Avel Peace Monchel Pollitt M.D on 02/06/2018   Between 7am to 6pm - Pager - [phone]  After 6pm go to [email or web address] - password [email or web address] [place] [facility]  Office  [phone]  CC: Primary care physician; Venia Carbon, MD   Note: This dictation was prepared with Dragon dictation along with smaller phrase technology. Any transcriptional errors that result from this process are unintentional.

## 2018-02-06 NOTE — Plan of Care (Signed)

## 2018-02-06 NOTE — ED Triage Notes (Signed)
Pt arrives from [facility] via [facility] after being found "slumped over in her chair and decreased LOC" Protocol initiated.

## 2018-02-06 NOTE — ED Notes (Signed)
Report to receiving RN

## 2018-02-06 NOTE — Progress Notes (Signed)
Family Meeting Note  Advance Directive:yes  Today a meeting took place with the Patient.  Patient is able to participate  The following clinical team members were present during this meeting:MD  The following were discussed:Patient's diagnosis: Syncope with collapse, history of breast cancer, hypertension heart murmur, Patient's progosis: Unable to determine and Goals for treatment: Full Code  Additional follow-up to be provided: prn  Time spent during discussion:20 minutes  Gorden Harms, MD

## 2018-02-06 NOTE — ED Notes (Signed)
Pt to CT

## 2018-02-06 NOTE — ED Notes (Signed)
This RN received call from [facility] stating that when pt D/C the pt is to go to their respite care housing. The staff is aware of possible weekend d/c and they are to call Nurses station [phone] or [phone].

## 2018-02-06 NOTE — Clinical Social Work Note (Signed)
Clinical Social Work Assessment  Patient Details  Name: Robin Lynn MRN: [id number] Date of Birth: 26-Nov-1918  Date of referral:  02/06/18               Reason for consult:  Other (Comment Required)(From a facilty)                Permission sought to share information with:  Family Supports Permission granted to share information::  Yes, Verbal Permission Granted  Name::     Greg Cutter from [facility]- Present with pts verbal consent  Agency::     Relationship::     Contact Information:  [place]  Housing/Transportation Living arrangements for the past 2 months:  Assisted Living Facility([facility]) Source of Information:  Patient, Other (Comment Required)(Volunteer Greg Cutter) Patient Interpreter Needed:  None Criminal Activity/Legal Involvement Pertinent to Current Situation/Hospitalization:  No - Comment as needed Significant Relationships:  Adult Children Lives with:  Facility Resident Do you feel safe going back to the place where you live?  Yes Need for family participation in patient care:  Yes (Comment)  Care giving concerns: No care giving concerns   Social Worker assessment / plan: LCSW introduced myself to patient and her "companionEvent organiser from [place]. Patient gave verbal consent to speak in front of Mr Philomena Course. Routine questions were asked and patient appeared calm and polite. She is HARD of HEARING, Good vision and able to speak well. She is widowed and has 4 children who are not in close proximity but stay in regular contact. Patient is very independent with her ADLs and walks using a walker, she felt faint and dizzy today and that is why she is here in observation. Upon d/c she is to return to [facility] . She is resident in the ALF.  Patient was not certain who her HCPOA is however was oriented x4. Currently there is a DNR on her chart.  Employment status:  Retired Insurance underwriter information:  Air cabin crew) PT Recommendations:  Not assessed at this time Information / Referral to community resources:  Other (Comment Required)(No resources requested)  Patient/Family's Response to care: TBD- Daughter called patient is to return [facility]  Patient/Family's Understanding of and Emotional Response to Diagnosis, Current Treatment, and Prognosis: Good understanding to her situation  Emotional Assessment Appearance:    Attitude/Demeanor/Rapport:  Gracious Affect (typically observed):  Stable, Calm Orientation:  Oriented to Self, Oriented to Place, Oriented to Situation Alcohol / Substance use:  Not Applicable Psych involvement (Current and /or in the community):  No (Comment)  Discharge Needs  Concerns to be addressed:  No discharge needs identified Readmission within the last 30 days:  No Current discharge risk:  None Barriers to Discharge:  No Barriers Identified   Joana Reamer, LCSW 02/06/2018, 2:39 PM

## 2018-02-06 NOTE — ED Notes (Signed)
DNR order paper sent to floor with patient.

## 2018-02-06 NOTE — ED Provider Notes (Signed)
[facility] Emergency Department Provider Note   ____________________________________________   First MD Initiated Contact with Patient 02/06/18 1101     (approximate)  I have reviewed the triage vital signs and the nursing notes.   HISTORY  Chief Complaint Loss of Consciousness    HPI Robin Lynn is a 84 y.o. female here for evaluation after being found unresponsive seated in a chair  Patient herself does not recall well she reports she has "memory issues".  She reports that she just does not felt too well she is feels tired.  Slightly more so than normal.  Reports she thinks she is eating well and does not remember being at the hospital couple of days ago either though after almost passing out  Denies any headache.  No chest pain.  No trouble breathing.  No fevers or chills.  Past Medical History:  Diagnosis Date  . Cancer ([place])   . History of breast cancer    left  . HLD (hyperlipidemia)   . HTN (hypertension)   . Urinary incontinence     Patient Active Problem List   Diagnosis Date Noted  . Syncope and collapse 02/06/2018  . History of breast cancer 12/05/2017  . Advance directive discussed with patient 08/21/2016  . Vascular dementia without behavioral disturbance 02/14/2016  . HTN (hypertension) 03/23/2015  . HLD (hyperlipidemia) 03/23/2015    Past Surgical History:  Procedure Laterality Date  . APPENDECTOMY    . BREAST LUMPECTOMY Left    with radiation    Prior to Admission medications   Medication Sig Start Date End Date Taking? Authorizing Provider  acetaminophen (TYLENOL) 325 MG tablet Take 325-650 mg by mouth every 4 (four) hours as needed.   Yes [provider]  alum & mag hydroxide-simeth (MAALOX/MYLANTA) 200-200-20 MG/5ML suspension Take 30 mLs by mouth every 4 (four) hours as needed for indigestion or heartburn.   Yes [provider]  bismuth subsalicylate (KAOPECTATE) 262 MG/15ML suspension Take 10  mLs by mouth 3 (three) times daily as needed.   Yes [provider]  co-enzyme Q-10 30 MG capsule Take 1 capsule by mouth daily.   Yes [provider]  escitalopram (LEXAPRO) 10 MG tablet Take 10 mg by mouth daily.   Yes [provider]  guaiFENesin-dextromethorphan (ROBITUSSIN DM) 100-10 MG/5ML syrup Take 10 mLs by mouth every 4 (four) hours as needed for cough.   Yes [provider]  losartan-hydrochlorothiazide (HYZAAR) 50-12.5 MG tablet TAKE 1 TABLET BY MOUTH ONCE DAILY FOR BP 05/16/17  Yes Viviana Simpler I, MD  magnesium hydroxide (MILK OF MAGNESIA) 400 MG/5ML suspension Take 30 mLs by mouth daily as needed for mild constipation.   Yes [provider]  nystatin (MYCOSTATIN/NYSTOP) powder Apply 1 g topically 2 (two) times daily. To folds and under the breasts   Yes [provider]  therapeutic multivitamin-minerals (THERAGRAN-M) tablet Take 1 tablet by mouth daily.   Yes [provider]  Vitamin D, Cholecalciferol, 1000 units CAPS Take by mouth.   Yes [provider]  vitamin E 400 UNIT capsule Take 400 Units by mouth daily.   Yes [provider]    Allergies Patient has no known allergies.  Family History  Problem Relation Age of Onset  . CAD Father   . Heart attack Father   . Cancer Unknown   . CAD Sister   . Diabetes Neg Hx     Social History Social History   Tobacco Use  .  Smoking status: Never Smoker  . Smokeless tobacco: Never Used  Substance Use Topics  . Alcohol use: Yes    Alcohol/week: 0.6 oz    Types: 1 Glasses of wine per week  . Drug use: No    Review of Systems  EM caveat: Felt somewhat unreliable as patient reports she does have memory difficulties, cannot recall events well including those even a few days ago  Constitutional: No fever/chills Eyes: No visual changes.  No headache. ENT: No sore throat.  No neck pain Cardiovascular: Denies chest pain. Respiratory: Denies  shortness of breath. Gastrointestinal: No abdominal pain.  Neurological: Negative for headaches, focal weakness or numbness.    ____________________________________________   PHYSICAL EXAM:  VITAL SIGNS: ED Triage Vitals  Enc Vitals Group     BP 02/06/18 1030 (!) 118/40     Pulse Rate 02/06/18 1030 71     Resp 02/06/18 1030 16     Temp 02/06/18 1046 98.2 F (36.8 C)     Temp Source 02/06/18 1046 Oral     SpO2 02/06/18 1030 97 %     Weight 02/06/18 1048 140 lb (63.5 kg)     Height 02/06/18 1048 5\' 5"  (1.651 m)     Head Circumference --      Peak Flow --      Pain Score 02/06/18 1048 0     Pain Loc --      Pain Edu? --      Excl. in [place]? --     Constitutional: Alert and oriented to self, not to situation of today or 2-year. Well appearing and in no acute distress. Eyes: Conjunctivae are normal. Head: Atraumatic. Nose: No congestion/rhinnorhea. Mouth/Throat: Mucous membranes are slightly dry Neck: No stridor.   Cardiovascular: Normal rate, regular rhythm. Grossly normal heart sounds.  Good peripheral circulation. Respiratory: Normal respiratory effort.  No retractions. Lungs CTAB. Gastrointestinal: Soft and nontender. No distention. Musculoskeletal: No lower extremity tenderness nor edema.  Moves all extremities well.  Some symmetric weakness in the lower extremities with no focality, about 4-5 bilateral lower extremities. Neurologic:  Normal speech and language. No gross focal neurologic deficits are appreciated.  Skin:  Skin is warm, dry and intact. No rash noted. Psychiatric: Mood and affect are normal. Speech and behavior are normal.  ____________________________________________   LABS (all labs ordered are listed, but only abnormal results are displayed)  Labs Reviewed  BASIC METABOLIC PANEL - Abnormal; Notable for the following components:      Result Value   Sodium 129 (*)    Chloride 95 (*)    Glucose, Bld 122 (*)    Calcium 8.5 (*)    GFR calc non Af Amer  52 (*)    All other components within normal limits  URINALYSIS, COMPLETE (UACMP) WITH MICROSCOPIC - Abnormal; Notable for the following components:   Color, Urine YELLOW (*)    APPearance HAZY (*)    All other components within normal limits  TSH - Abnormal; Notable for the following components:   TSH 5.798 (*)    All other components within normal limits  CBC  SODIUM, URINE, RANDOM  CREATININE, URINE, RANDOM   ____________________________________________  EKG  ED ECG REPORT I, Delman Kitten, the attending physician, personally viewed and interpreted this ECG.  Date: 02/06/2018 EKG Time: 1030 Rate: 70 Rhythm: normal sinus rhythm QRS Axis: normal Intervals: normal ST/T Wave abnormalities: normal Narrative Interpretation: no evidence of acute ischemia  ____________________________________________  RADIOLOGY  Ct Head Wo Contrast  Result Date: 02/06/2018 CLINICAL DATA:  Decreased level of consciousness EXAM: CT HEAD WITHOUT CONTRAST TECHNIQUE: Contiguous axial images were obtained from the base of the skull through the vertex without intravenous contrast. COMPARISON:  08/21/2017 FINDINGS: Brain: Diffuse atrophic changes and chronic white matter ischemic changes are seen. The overall appearance is stable from the prior exam. No acute hemorrhage, acute infarction or space-occupying mass lesion is seen. Vascular: No hyperdense vessel or unexpected calcification. Skull: Normal. Negative for fracture or focal lesion. Sinuses/Orbits: No acute finding. Other: None. IMPRESSION: Chronic white matter ischemic changes and atrophic changes without acute abnormality Electronically Signed   By: Inez Catalina M.D.   On: 02/06/2018 12:12    CT the head negative for acute ____________________________________________   PROCEDURES  Procedure(s) performed: None  Procedures  Critical Care performed: No  ____________________________________________   INITIAL IMPRESSION / ASSESSMENT AND PLAN /  ED COURSE  Pertinent labs & imaging results that were available during my care of the patient were reviewed by me and considered in my medical decision making (see chart for details).  Patient evaluated after syncopal episode, now alert and in no acute distress.  Denying pain or injury.  No evidence of injury on exam, and she was not been noted to fall but was rather found unresponsive in a chair briefly.  Appears to be close to her baseline now but some memory problems, also noted some ongoing hyponatremia with dry mucous membrane suspect likely some dehydration  Will hydrate, based on the patient's age presentation twice now with near syncopal and now syncopal episode the last few days time will admit for further work-up and hospitalization.  No obvious acute etiology or acute cardiopulmonary/neurologic event to noted in the ER but will require further inpatient monitoring.      ____________________________________________   FINAL CLINICAL IMPRESSION(S) / ED DIAGNOSES  Final diagnoses:  Syncope and collapse  Hyponatremia      NEW MEDICATIONS STARTED DURING THIS VISIT:  New Prescriptions   No medications on file     Note:  This document was prepared using Dragon voice recognition software and may include unintentional dictation errors.     Delman Kitten, MD 02/06/18 [phone]

## 2018-02-07 ENCOUNTER — Observation Stay (HOSPITAL_BASED_OUTPATIENT_CLINIC_OR_DEPARTMENT_OTHER)
Admit: 2018-02-07 | Discharge: 2018-02-07 | Disposition: A | Discharge status: Home / Self Care | Payer: Medicare Other | Attending: Family Medicine | Admitting: Family Medicine

## 2018-02-07 ENCOUNTER — Observation Stay: Payer: Medicare Other

## 2018-02-07 DIAGNOSIS — R55 Syncope and collapse: Secondary | ICD-10-CM

## 2018-02-07 DIAGNOSIS — R748 Abnormal levels of other serum enzymes: Secondary | ICD-10-CM | POA: Diagnosis not present

## 2018-02-07 DIAGNOSIS — I351 Nonrheumatic aortic (valve) insufficiency: Secondary | ICD-10-CM | POA: Diagnosis not present

## 2018-02-07 DIAGNOSIS — R7989 Other specified abnormal findings of blood chemistry: Secondary | ICD-10-CM | POA: Diagnosis not present

## 2018-02-07 LAB — BASIC METABOLIC PANEL
Anion gap: 6 (ref 5–15)
BUN: 13 mg/dL (ref 8–23)
CO2: 26 mmol/L (ref 22–32)
Calcium: 8.2 mg/dL — ABNORMAL LOW (ref 8.9–10.3)
Chloride: 101 mmol/L (ref 98–111)
Creatinine, Ser: 0.55 mg/dL (ref 0.44–1.00)
GFR calc Af Amer: >60 mL/min (ref >60)
GFR calc non Af Amer: >60 mL/min (ref >60)
Glucose, Bld: 98 mg/dL (ref 70–99)
Potassium: 3.5 mmol/L (ref 3.5–5.1)
Sodium: 133 mmol/L — ABNORMAL LOW (ref 135–145)

## 2018-02-07 LAB — APTT: aPTT: 35 seconds (ref 24–36)

## 2018-02-07 LAB — PROTIME-INR
INR: 1.02
Prothrombin Time: 13.3 seconds (ref 11.4–15.2)

## 2018-02-07 LAB — ECHOCARDIOGRAM COMPLETE
Height: 65 in
Weight: 2243.4 oz

## 2018-02-07 LAB — TROPONIN I
Troponin I: 0.24 ng/mL (ref <0.03)
Troponin I: 0.2 ng/mL (ref <0.03)
Troponin I: 0.15 ng/mL (ref <0.03)

## 2018-02-07 MED ORDER — HEPARIN BOLUS VIA INFUSION
2000.0000 [IU] | Freq: Once | INTRAVENOUS | Status: AC
  Administered 2018-02-07: 2000 [IU] via INTRAVENOUS
  Filled 2018-02-07: qty 2000

## 2018-02-07 MED ORDER — SODIUM CHLORIDE 0.9% FLUSH
3.0000 mL | Freq: Two times a day (BID) | INTRAVENOUS | Status: DC
  Administered 2018-02-07 – 2018-02-08 (×2): 3 mL via INTRAVENOUS

## 2018-02-07 MED ORDER — HEPARIN (PORCINE) IN NACL 100-0.45 UNIT/ML-% IJ SOLN
750.0000 [IU]/h | INTRAMUSCULAR | Status: DC
  Administered 2018-02-07: 750 [IU]/h via INTRAVENOUS
  Filled 2018-02-07: qty 250

## 2018-02-07 MED ORDER — ASPIRIN 81 MG PO CHEW
81.0000 mg | CHEWABLE_TABLET | Freq: Every day | ORAL | Status: DC
  Administered 2018-02-07 – 2018-02-08 (×2): 81 mg via ORAL
  Filled 2018-02-07 (×2): qty 1

## 2018-02-07 MED ORDER — ASPIRIN EC 325 MG PO TBEC
325.0000 mg | DELAYED_RELEASE_TABLET | Freq: Once | ORAL | Status: AC
  Administered 2018-02-07: 325 mg via ORAL
  Filled 2018-02-07: qty 1

## 2018-02-07 MED ORDER — ENOXAPARIN SODIUM 40 MG/0.4ML ~~LOC~~ SOLN
40.0000 mg | SUBCUTANEOUS | Status: DC
  Administered 2018-02-07: 18:00:00 40 mg via SUBCUTANEOUS
  Filled 2018-02-07: qty 0.4

## 2018-02-07 NOTE — Evaluation (Signed)
Physical Therapy Evaluation Patient Details Name: Robin Lynn MRN: [id number] DOB: June 01, 1919 Today's Date: 02/07/2018   History of Present Illness  84 y/o female here with syncope/fall at her ALF.    Clinical Impression  Pt very pleasant and showed good effort t/o the PT exam but did need some extra cuing and time to perform all tasks.  She was able to do most mobility acts with little to no assist and was able to participate with ~10 minutes of mobility and gait training apart from the PT exam (slow but safe 100 ft with FWW, pt typically uses 4WW).  Pt should be safe to return to her ALF, but would benefit from having PT services to fully work back to PLOF.    Follow Up Recommendations Home health PT    Equipment Recommendations  None recommended by PT    Recommendations for Other Services       Precautions / Restrictions Precautions Precautions: Fall Restrictions Weight Bearing Restrictions: No      Mobility  Bed Mobility Overal bed mobility: Independent             General bed mobility comments: Pt able to get to EOB w/o direct assist  Transfers Overall transfer level: Modified independent Equipment used: Rolling walker (2 wheeled)             General transfer comment: VCs for set up and hand placement, ultimately able to rise w/o assist  Ambulation/Gait Ambulation/Gait assistance: Supervision Gait Distance (Feet): 100 Feet Assistive device: Rolling walker (2 wheeled)       General Gait Details: Pt used to rollator so had some issues with turning, but ultimately showed good ability to maintain cadence (with consistent cuing/gait trainging) on straightaways and had stable vitals t/o the effort.  She showed good safety and confidence.   Stairs            Wheelchair Mobility    Modified Rankin (Stroke Patients Only)       Balance Overall balance assessment: Modified Independent                                            Pertinent Vitals/Pain Pain Assessment: No/denies pain    Home Living Family/patient expects to be discharged to:: Assisted living               Home Equipment: Walker - 4 wheels      Prior Function Level of Independence: Independent with assistive device(s)         Comments: Pt able to walk to meals, reports that she in up ad lib QD     Hand Dominance        Extremity/Trunk Assessment   Upper Extremity Assessment Upper Extremity Assessment: Overall WFL for tasks assessed(age appropraite)    Lower Extremity Assessment Lower Extremity Assessment: Overall WFL for tasks assessed(age approrpaite)       Communication   Communication: No difficulties  Cognition Arousal/Alertness: Awake/alert(unsure of date, but knew her birthday was soon) Behavior During Therapy: WFL for tasks assessed/performed Overall Cognitive Status: (mild deficits, but appropriate and able to converse)                                        General Comments      Exercises  Assessment/Plan    PT Assessment Patient needs continued PT services  PT Problem List Decreased strength;Decreased range of motion;Decreased activity tolerance;Decreased balance;Decreased mobility;Decreased coordination;Decreased knowledge of use of DME;Decreased safety awareness;Decreased cognition       PT Treatment Interventions DME instruction;Gait training;Functional mobility training;Therapeutic activities;Therapeutic exercise;Balance training;Neuromuscular re-education;Patient/family education    PT Goals (Current goals can be found in the Care Plan section)  Acute Rehab PT Goals Patient Stated Goal: Go back to ALF PT Goal Formulation: With patient Time For Goal Achievement: 02/21/18 Potential to Achieve Goals: Good    Frequency Min 2X/week   Barriers to discharge        Co-evaluation               AM-PAC PT "6 Clicks" Daily Activity  Outcome Measure Difficulty  turning over in bed (including adjusting bedclothes, sheets and blankets)?: None Difficulty moving from lying on back to sitting on the side of the bed? : A Little Difficulty sitting down on and standing up from a chair with arms (e.g., wheelchair, bedside commode, etc,.)?: A Lot Help needed moving to and from a bed to chair (including a wheelchair)?: None Help needed walking in hospital room?: A Little Help needed climbing 3-5 steps with a railing? : A Little 6 Click Score: 19    End of Session Equipment Utilized During Treatment: Gait belt Activity Tolerance: Patient tolerated treatment well Patient left: with chair alarm set;with call bell/phone within reach   PT Visit Diagnosis: Muscle weakness (generalized) (M62.81);Difficulty in walking, not elsewhere classified (R26.2)    Time: 4496-7591 PT Time Calculation (min) (ACUTE ONLY): 40 min   Charges:   PT Evaluation $PT Eval Low Complexity: 1 Low PT Treatments $Gait Training: 8-22 mins        Kreg Shropshire, DPT 02/07/2018, 11:33 AM

## 2018-02-07 NOTE — Progress Notes (Signed)
ANTICOAGULATION CONSULT NOTE - Initial Consult  Pharmacy Consult for heparin Indication: chest pain/ACS  No Known Allergies  Patient Measurements: Height: 5\' 5"  (165.1 cm) Weight: 140 lb (63.5 kg) IBW/kg (Calculated) : 57 Heparin Dosing Weight: 64 kg  Vital Signs: Temp: 98 F (36.7 C) (08/03 0441) Temp Source: Oral (08/03 0441) BP: 148/61 (08/03 0441) Pulse Rate: 76 (08/03 0441)  Labs: Recent Labs    02/06/18 1038 02/06/18 1634 02/06/18 2228 02/07/18 0403  HGB 12.9  --   --   --   HCT 36.8  --   --   --   PLT 230  --   --   --   CREATININE 0.90  --   --  0.55  TROPONINI  --  <0.03 0.06* 0.24*    Estimated Creatinine Clearance: 37 mL/min (by C-G formula based on SCr of 0.55 mg/dL).   Medical History: Past Medical History:  Diagnosis Date  . Cancer ([place])   . History of breast cancer    left  . HLD (hyperlipidemia)   . HTN (hypertension)   . Urinary incontinence     Medications:  Scheduled:  . aspirin  81 mg Oral Daily  . aspirin EC  325 mg Oral Once  . escitalopram  10 mg Oral Daily  . heparin  2,000 Units Intravenous Once  . losartan  50 mg Oral Daily   And  . hydrochlorothiazide  12.5 mg Oral Daily  . multivitamin with minerals  1 tablet Oral Daily  . nystatin  1 g Topical BID    Assessment: Patient admitted for LOC was slumped over at facility w/ dehydration. CT NG, patients trops began to rise 0.06 >> 0.24.  EKG NSR Patient does not take any anticoagulation PTA. Being started on a heparin drip for ACS  Goal of Therapy:  Heparin level 0.3-0.7 units/ml Monitor platelets by anticoagulation protocol: Yes   Plan:  Patient received enoxaparin 40 mg subq x 1 for VTE prophylaxis will give a half-bolus of heparin 2000 units IV x 1 Will start heparin drip at 750 units/hr and will check anti-Xa @ 1300. Baseline labs drawn Will monitor daily CBC and adjust per anti-Xa levels.  Tobie Lords, PharmD, BCPS Clinical Pharmacist 02/07/2018

## 2018-02-07 NOTE — Progress Notes (Signed)
*  PRELIMINARY RESULTS* Echocardiogram 2D Echocardiogram has been performed. Very limited Echo performed due to poor patient compliance.  [place] 02/07/2018, 3:32 PM

## 2018-02-07 NOTE — Plan of Care (Signed)

## 2018-02-07 NOTE — Progress Notes (Signed)
Dgt called to day with update done, education done and spoke with Dr. Tressia Miners. Requested copy of HPOA and living will. Pt remains confused with alternating periods of being irritable and resolves quickly. Refused to speak to family member on phone. Ambulated in hall with PT with walker and did well; up in chair and tolerated well. Denies co's. VSS.

## 2018-02-07 NOTE — Progress Notes (Signed)
[facility] NAME: Robin Lynn    MR#:  [id number]  DATE OF BIRTH:  September 02, 1918  SUBJECTIVE:  CHIEF COMPLAINT:   Chief Complaint  Patient presents with  . Loss of Consciousness   -Patient admitted with syncope, noted to have elevated troponin. -Complains of a sinking feeling.  Sodium is improving  REVIEW OF SYSTEMS:  Review of Systems  Constitutional: Positive for malaise/fatigue. Negative for chills and fever.  HENT: Positive for hearing loss.   Respiratory: Negative for cough, shortness of breath and wheezing.   Cardiovascular: Negative for chest pain, palpitations and leg swelling.  Gastrointestinal: Negative for abdominal pain, constipation, diarrhea, nausea and vomiting.  Genitourinary: Negative for dysuria.  Musculoskeletal: Positive for myalgias.  Neurological: Positive for weakness. Negative for dizziness, seizures and headaches.    DRUG ALLERGIES:  No Known Allergies  VITALS:  Blood pressure (!) 138/56, pulse 76, temperature 97.6 F (36.4 C), temperature source Oral, resp. rate 20, height 5\' 5"  (1.651 m), weight 63.6 kg (140 lb 3.4 oz), SpO2 100 %.  PHYSICAL EXAMINATION:  Physical Exam  GENERAL:  84 y.o.-year-old ill nourished patient sitting in the bed with no acute distress.  EYES: Pupils equal, round, reactive to light and accommodation. No scleral icterus. Extraocular muscles intact.  HEENT: Head atraumatic, normocephalic. Oropharynx and nasopharynx clear.  NECK:  Supple, no jugular venous distention. No thyroid enlargement, no tenderness.  LUNGS: Normal breath sounds bilaterally, no wheezing, rales,rhonchi or crepitation. No use of accessory muscles of respiration.  Decreased bibasilar breath sounds. CARDIOVASCULAR: S1, S2 normal. No rubs, or gallops.  3/6 systolic murmur is present ABDOMEN: Soft, nontender, nondistended. Bowel sounds present. No organomegaly or mass.  EXTREMITIES: No pedal edema, cyanosis,  or clubbing.  NEUROLOGIC: Cranial nerves II through XII are intact. Muscle strength 5/5 in all extremities. Sensation intact. Gait not checked.  PSYCHIATRIC: The patient is alert and oriented x 3.  Intermittent confusion and forgetfulness noted SKIN: No obvious rash, lesion, or ulcer.    LABORATORY PANEL:   CBC Recent Labs  Lab 02/06/18 1038  WBC 7.0  HGB 12.9  HCT 36.8  PLT 230   ------------------------------------------------------------------------------------------------------------------  Chemistries  Recent Labs  Lab 02/03/18 1307  02/07/18 0403  NA 130*   < > 133*  K 3.9   < > 3.5  CL 97*   < > 101  CO2 26   < > 26  GLUCOSE 154*   < > 98  BUN 14   < > 13  CREATININE 0.58   < > 0.55  CALCIUM 8.6*   < > 8.2*  AST 25  --   --   ALT 12  --   --   ALKPHOS 61  --   --   BILITOT 0.7  --   --    < > = values in this interval not displayed.   ------------------------------------------------------------------------------------------------------------------  Cardiac Enzymes Recent Labs  Lab 02/07/18 1125  TROPONINI 0.15*   ------------------------------------------------------------------------------------------------------------------  RADIOLOGY:  Ct Head Wo Contrast  Result Date: 02/06/2018 CLINICAL DATA:  Decreased level of consciousness EXAM: CT HEAD WITHOUT CONTRAST TECHNIQUE: Contiguous axial images were obtained from the base of the skull through the vertex without intravenous contrast. COMPARISON:  08/21/2017 FINDINGS: Brain: Diffuse atrophic changes and chronic white matter ischemic changes are seen. The overall appearance is stable from the prior exam. No acute hemorrhage, acute infarction or space-occupying mass lesion is seen. Vascular: No hyperdense vessel or unexpected  calcification. Skull: Normal. Negative for fracture or focal lesion. Sinuses/Orbits: No acute finding. Other: None. IMPRESSION: Chronic white matter ischemic changes and atrophic changes  without acute abnormality Electronically Signed   By: Inez Catalina M.D.   On: 02/06/2018 12:12   US Carotid Bilateral  Result Date: 02/07/2018 CLINICAL DATA:  Syncope and collapse. History hypertension and hyperlipidemia EXAM: BILATERAL CAROTID DUPLEX ULTRASOUND TECHNIQUE: Pearline Cables scale imaging, color Doppler and duplex ultrasound were performed of bilateral carotid and vertebral arteries in the neck. COMPARISON:  03/24/2015 FINDINGS: Criteria: Quantification of carotid stenosis is based on velocity parameters that correlate the residual internal carotid diameter with NASCET-based stenosis levels, using the diameter of the distal internal carotid lumen as the denominator for stenosis measurement. The following velocity measurements were obtained: RIGHT ICA:  107/14 cm/sec CCA:  94/7 cm/sec SYSTOLIC ICA/CCA RATIO:  1.1 ECA:  209 cm/sec LEFT ICA:  137/17 cm/sec CCA:  096/2 cm/sec SYSTOLIC ICA/CCA RATIO:  1.1 ECA:  179 cm/sec RIGHT CAROTID ARTERY: There is a minimal amount of echogenic plaque within the right carotid bulb (image 15). There is a moderate amount predominantly hypoechoic plaque involving the origin and proximal aspects of the right internal carotid artery (image 22), morphologically similar to the 03/2015 examination and not definitely resulting in elevated peak systolic velocities within the right internal carotid artery to suggest a hemodynamically significant stenosis. Borderline elevated peak systolic velocity within distal aspect the right internal carotid artery is felt to be factitiously elevated due to sampling at the periphery of the vessel. RIGHT VERTEBRAL ARTERY:  Antegrade Flow LEFT CAROTID ARTERY: There is a moderate amount of echogenic plaque within the left carotid bulb (image 48). There is a minimal amount of mixed echogenic plaque involving the origin and proximal aspects of the left internal carotid artery (image 56), morphologically similar to the 03/2015 examination and again resulting  in elevated peak systolic velocities within the proximal and mid aspects of the left internal carotid artery. Greatest acquired peak systolic velocity within the mid left ICA measures 137 centimeter/second (image 60), previously, greatest acquired peak systolic velocity within the proximal left ICA measures 144 centimeters/second. LEFT VERTEBRAL ARTERY:  Antegrade flow Incidental note is made of a cardiac arrhythmia. IMPRESSION: 1. Moderate amount of left-sided atherosclerotic plaque, morphologically similar to the 03/2015 examination, and again results in elevated peak systolic velocities within the left internal carotid artery compatible with the 50-69% luminal narrowing range. Further evaluation with CTA could performed as clinically indicated. 2. Minimal to moderate amount of right-sided atherosclerotic plaque, similar to the 03/2015 examination and again not resulting in a hemodynamically significant stenosis. 3. Incidental note made of an apparent cardiac arrhythmia. Further evaluation with ECG monitoring could be performed as indicated. Electronically Signed   By: Sandi Mariscal M.D.   On: 02/07/2018 09:28    EKG:   Orders placed or performed during the hospital encounter of 02/06/18  . EKG 12-Lead  . EKG 12-Lead  . ED EKG  . ED EKG  . EKG 12-Lead  . EKG 12-Lead  . EKG 12-Lead  . EKG 12-Lead  . EKG 12-Lead  . EKG 12-Lead    ASSESSMENT AND PLAN:   84 year old female with past medical history significant for hypertension, history of breast cancer, urinary incontinence was brought in secondary to weakness and a syncopal episode  1.  Syncope-concern for vasovagal syncope.  However cardiac causes cannot be ruled out -Cardiology consult pending -Echocardiogram and carotid Dopplers ordered.  Urine analysis with no infection -CT of  the head with no acute findings but chronic microvascular ischemic changes -Physical therapy recommended home health  2.  Elevated troponin-could be demand  ischemia.  Cardiology consult requested -Currently on heparin drip, since troponins are plateaued-we will discontinue that -Follow-up echocardiogram.  3.  Hyponatremia-receiving gentle hydration.  Discontinue hydrochlorothiazide  4.  Hypertension on losartan  5.  Depression-on Lexapro  6.  DVT prophylaxis-Lovenox   Updated daughter Ms. Cheryl over the phone. She is patient HCPOA   All the records are reviewed and case discussed with Care Management/Social Workerr. Management plans discussed with the patient, family and they are in agreement.  CODE STATUS: DNR  TOTAL TIME TAKING CARE OF THIS PATIENT: 37 minutes.   POSSIBLE D/C IN 1-2 DAYS, DEPENDING ON CLINICAL CONDITION.   Ario Mcdiarmid M.D on 02/07/2018 at 1:05 PM  Between 7am to 6pm - Pager - [phone]  After 6pm go to [email or web address] - password [email or web address] [facility]  Office  [phone]  CC: Primary care physician; Venia Carbon, MD

## 2018-02-07 NOTE — Clinical Social Work Note (Signed)
CSW received consult to coordinate discharge needs. The ED CSW had already assessed this patient, and the CSW is following.  Robin Lynn, MSW, [name] [phone]

## 2018-02-07 NOTE — Consult Note (Signed)
Cardiology Consultation:   Patient ID: Robin Lynn; [id number]; [birth year]   Admit date: 02/06/2018 Date of Consult: 02/07/2018  Primary Care Provider: Venia Carbon, MD  Patient Profile:   Robin Lynn is a 84 y.o. female with a hx of HTN and prior breast CA who is being seen today for the evaluation of syncope and elevated troponin at the request of Dr Tressia Miners.  History of Present Illness:   Ms. Mell presents after having a syncopal event at her assisted living facility.  Per report, she slumped over in her chair at the facility and was found to have reduced consciousness.  Upon waking, she did not recall the event.  Currently, she is unable to provide further history.  She reports feeling "washed out" currently but denies chest pain, SOB, or other symptoms.  She is unaware of any prior syncope.  Past Medical History:  Diagnosis Date  . Cancer ([place])   . History of breast cancer    left  . HLD (hyperlipidemia)   . HTN (hypertension)   . Urinary incontinence     Past Surgical History:  Procedure Laterality Date  . APPENDECTOMY    . BREAST LUMPECTOMY Left    with radiation     Home Medications:  Prior to Admission medications   Medication Sig Start Date End Date Taking? Authorizing Provider  acetaminophen (TYLENOL) 325 MG tablet Take 325-650 mg by mouth every 4 (four) hours as needed.   Yes [provider]  alum & mag hydroxide-simeth (MAALOX/MYLANTA) 200-200-20 MG/5ML suspension Take 30 mLs by mouth every 4 (four) hours as needed for indigestion or heartburn.   Yes [provider]  bismuth subsalicylate (KAOPECTATE) 262 MG/15ML suspension Take 10 mLs by mouth 3 (three) times daily as needed.   Yes [provider]  co-enzyme Q-10 30 MG capsule Take 1 capsule by mouth daily.   Yes [provider]  escitalopram (LEXAPRO) 10 MG tablet Take 10 mg by mouth daily.   Yes [provider]  guaiFENesin-dextromethorphan (ROBITUSSIN DM)  100-10 MG/5ML syrup Take 10 mLs by mouth every 4 (four) hours as needed for cough.   Yes [provider]  losartan-hydrochlorothiazide (HYZAAR) 50-12.5 MG tablet TAKE 1 TABLET BY MOUTH ONCE DAILY FOR BP 05/16/17  Yes Viviana Simpler I, MD  magnesium hydroxide (MILK OF MAGNESIA) 400 MG/5ML suspension Take 30 mLs by mouth daily as needed for mild constipation.   Yes [provider]  nystatin (MYCOSTATIN/NYSTOP) powder Apply 1 g topically 2 (two) times daily. To folds and under the breasts   Yes [provider]  therapeutic multivitamin-minerals (THERAGRAN-M) tablet Take 1 tablet by mouth daily.   Yes [provider]  Vitamin D, Cholecalciferol, 1000 units CAPS Take by mouth.   Yes [provider]  vitamin E 400 UNIT capsule Take 400 Units by mouth daily.   Yes [provider]    Inpatient Medications: Scheduled Meds: . aspirin  81 mg Oral Daily  . enoxaparin (LOVENOX) injection  40 mg Subcutaneous Q24H  . escitalopram  10 mg Oral Daily  . losartan  50 mg Oral Daily  . multivitamin with minerals  1 tablet Oral Daily  . nystatin  1 g Topical BID  . sodium chloride flush  3 mL Intravenous Q12H   Continuous Infusions: . sodium chloride 75 mL/hr at 02/07/18 0610   PRN Meds: acetaminophen **OR** acetaminophen, guaiFENesin-dextromethorphan, hydrALAZINE, HYDROcodone-acetaminophen, ondansetron **OR** ondansetron (ZOFRAN) IV, polyethylene glycol  Allergies:   No Known Allergies  Social History:   Social History   Socioeconomic History  . Marital status: Married    Spouse name: Not on file  . Number of children: 1  . Years of education: Not on file  . Highest education level: Not on file  Occupational History  . Occupation: Vocalist-- opera and showtunes    Comment: retired  Scientific laboratory technician  . Financial resource strain: Not on file  . Food insecurity:    Worry: Not on file    Inability: Not on file  . Transportation needs:    Medical:  Not on file    Non-medical: Not on file  Tobacco Use  . Smoking status: Never Smoker  . Smokeless tobacco: Never Used  Substance and Sexual Activity  . Alcohol use: Yes    Alcohol/week: 0.6 oz    Types: 1 Glasses of wine per week  . Drug use: No  . Sexual activity: Not on file  Lifestyle  . Physical activity:    Days per week: Not on file    Minutes per session: Not on file  . Stress: Not on file  Relationships  . Social connections:    Talks on phone: Not on file    Gets together: Not on file    Attends religious service: Not on file    Active member of club or organization: Not on file    Attends meetings of clubs or organizations: Not on file    Relationship status: Not on file  . Intimate partner violence:    Fear of current or ex partner: Not on file    Emotionally abused: Not on file    Physically abused: Not on file    Forced sexual activity: Not on file  Other Topics Concern  . Not on file  Social History Narrative   2nd marriage---currently married ~55 years   1 daughter   1 son who died      Has living will   Husband is health care POA   Discussed DNR--she requests (done 02/14/16)   No tube feeds if cognitively unaware    Family History:    Family History  Problem Relation Age of Onset  . CAD Father   . Heart attack Father   . Cancer Unknown   . CAD Sister   . Diabetes Neg Hx      ROS:  Patient is confused, tired and unable to complete a full ROS   Physical Exam/Data:   Vitals:   02/07/18 0441 02/07/18 0500 02/07/18 0822 02/07/18 1243  BP: (!) 148/61  (!) 165/68 (!) 138/56  Pulse: 76  79 76  Resp: 17  20 20   Temp: 98 F (36.7 C)  98.6 F (37 C) 97.6 F (36.4 C)  TempSrc: Oral  Oral Oral  SpO2: 96%  98% 100%  Weight:  140 lb 3.4 oz (63.6 kg)    Height:  5\' 5"  (1.651 m)      Intake/Output Summary (Last 24 hours) at 02/07/2018 1414 Last data filed at 02/07/2018 1353 Gross per 24 hour  Intake 2135.74 ml  Output 700 ml  Net 1435.74 ml    Filed Weights   02/06/18 1048 02/07/18 0500  Weight: 140 lb (63.5 kg) 140 lb 3.4 oz (63.6 kg)   Body mass index is 23.33 kg/m.  General:  Elderly and frail, sleeping but rouses, unable to provide history HEENT: normal Vascular: No carotid bruits  Cardiac:  RRR, 2/6 SEM LUSB Lungs:  clear to auscultation bilaterally, no wheezing,  rhonchi or rales  Abd: soft, nontender, no hepatomegaly  Ext: no edema Musculoskeletal: diffuse atrophy Skin: warm and dry  Neuro:   no focal abnormalities noted Psych:  confused  EKG:  The EKG was personally reviewed and demonstrates:  Sinus rhythm Telemetry:  Telemetry was personally reviewed and demonstrates:  Sinus rhythm, PACs, no AV block pauses, or ventricular arrhythmias   Laboratory Data:  Chemistry Recent Labs  Lab 02/03/18 1307 02/06/18 1038 02/07/18 0403  NA 130* 129* 133*  K 3.9 3.9 3.5  CL 97* 95* 101  CO2 26 26 26   GLUCOSE 154* 122* 98  BUN 14 11 13   CREATININE 0.58 0.90 0.55  CALCIUM 8.6* 8.5* 8.2*  GFRNONAA >60 52* >60  GFRAA >60 >60 >60  ANIONGAP 7 8 6     Recent Labs  Lab 02/03/18 1307  PROT 5.9*  ALBUMIN 3.7  AST 25  ALT 12  ALKPHOS 61  BILITOT 0.7   Hematology Recent Labs  Lab 02/03/18 1307 02/06/18 1038  WBC 8.1 7.0  RBC 4.63 4.41  HGB 13.1 12.9  HCT 39.2 36.8  MCV 84.7 83.4  MCH 28.3 29.1  MCHC 33.5 34.9  RDW 14.3 13.9  PLT 269 230   Cardiac Enzymes Recent Labs  Lab 02/03/18 1307 02/06/18 1634 02/06/18 2228 02/07/18 0403 02/07/18 0718 02/07/18 1125  TROPONINI <0.03 <0.03 0.06* 0.24* 0.20* 0.15*   No results for input(s): TROPIPOC in the last 168 hours.  BNPNo results for input(s): BNP, PROBNP in the last 168 hours.  DDimer No results for input(s): DDIMER in the last 168 hours.  Radiology/Studies:  Ct Head Wo Contrast  Result Date: 02/06/2018 CLINICAL DATA:  Decreased level of consciousness EXAM: CT HEAD WITHOUT CONTRAST TECHNIQUE: Contiguous axial images were obtained from the base of  the skull through the vertex without intravenous contrast. COMPARISON:  08/21/2017 FINDINGS: Brain: Diffuse atrophic changes and chronic white matter ischemic changes are seen. The overall appearance is stable from the prior exam. No acute hemorrhage, acute infarction or space-occupying mass lesion is seen. Vascular: No hyperdense vessel or unexpected calcification. Skull: Normal. Negative for fracture or focal lesion. Sinuses/Orbits: No acute finding. Other: None. IMPRESSION: Chronic white matter ischemic changes and atrophic changes without acute abnormality Electronically Signed   By: Inez Catalina M.D.   On: 02/06/2018 12:12   US Carotid Bilateral  Result Date: 02/07/2018 CLINICAL DATA:  Syncope and collapse. History hypertension and hyperlipidemia EXAM: BILATERAL CAROTID DUPLEX ULTRASOUND TECHNIQUE: Pearline Cables scale imaging, color Doppler and duplex ultrasound were performed of bilateral carotid and vertebral arteries in the neck. COMPARISON:  03/24/2015 FINDINGS: Criteria: Quantification of carotid stenosis is based on velocity parameters that correlate the residual internal carotid diameter with NASCET-based stenosis levels, using the diameter of the distal internal carotid lumen as the denominator for stenosis measurement. The following velocity measurements were obtained: RIGHT ICA:  107/14 cm/sec CCA:  10/2 cm/sec SYSTOLIC ICA/CCA RATIO:  1.1 ECA:  209 cm/sec LEFT ICA:  137/17 cm/sec CCA:  585/2 cm/sec SYSTOLIC ICA/CCA RATIO:  1.1 ECA:  179 cm/sec RIGHT CAROTID ARTERY: There is a minimal amount of echogenic plaque within the right carotid bulb (image 15). There is a moderate amount predominantly hypoechoic plaque involving the origin and proximal aspects of the right internal carotid artery (image 22), morphologically similar to the 03/2015 examination and not definitely resulting in elevated peak systolic velocities within the right internal carotid artery to suggest a hemodynamically significant stenosis.  Borderline elevated peak systolic velocity within distal aspect the  right internal carotid artery is felt to be factitiously elevated due to sampling at the periphery of the vessel. RIGHT VERTEBRAL ARTERY:  Antegrade Flow LEFT CAROTID ARTERY: There is a moderate amount of echogenic plaque within the left carotid bulb (image 48). There is a minimal amount of mixed echogenic plaque involving the origin and proximal aspects of the left internal carotid artery (image 56), morphologically similar to the 03/2015 examination and again resulting in elevated peak systolic velocities within the proximal and mid aspects of the left internal carotid artery. Greatest acquired peak systolic velocity within the mid left ICA measures 137 centimeter/second (image 60), previously, greatest acquired peak systolic velocity within the proximal left ICA measures 144 centimeters/second. LEFT VERTEBRAL ARTERY:  Antegrade flow Incidental note is made of a cardiac arrhythmia. IMPRESSION: 1. Moderate amount of left-sided atherosclerotic plaque, morphologically similar to the 03/2015 examination, and again results in elevated peak systolic velocities within the left internal carotid artery compatible with the 50-69% luminal narrowing range. Further evaluation with CTA could performed as clinically indicated. 2. Minimal to moderate amount of right-sided atherosclerotic plaque, similar to the 03/2015 examination and again not resulting in a hemodynamically significant stenosis. 3. Incidental note made of an apparent cardiac arrhythmia. Further evaluation with ECG monitoring could be performed as indicated. Electronically Signed   By: Sandi Mariscal M.D.   On: 02/07/2018 09:28    Assessment and Plan:   1. Syncope Unclear etiology Though I cannot exclude arrhythmia as the cause, she has a benign appearing ekg and no arrhythmias on telemetry.  Echo is peinding. I would advise 30 day monitor at discharge. Given low sodium, consider stopping  hctz long term If echo is low risk, no further inpatient workup planned  2. Elevated troponin Unclear etiology Likely demand ischemia in the setting of her syncopal event.  Cannot exclude arrhythmias (as above) Low level troponin is not consistent with acute MI.  She has no symptoms of ischemia presently. Awaiting echo  3. Hyponatremia/ hypochloremia Consider stopping hctz (as above)  4. HTN Noted May consider increasing losartan to 100mg  daily if needed   For questions or updates, please contact [facility] Please consult [email or web address] for contact info under Cardiology/STEMI.   Signed, Thompson Grayer, MD  02/07/2018 2:14 PM

## 2018-02-07 NOTE — Care Management Obs Status (Signed)
[facility] NOTIFICATION   Patient Details  Name: Robin Lynn MRN: [id number] Date of Birth: [birth year]   Medicare Observation Status Notification Given:  Yes    Teresia Myint A Amita Atayde, RN 02/07/2018, 9:15 AM

## 2018-02-08 LAB — BASIC METABOLIC PANEL
Anion gap: 5 (ref 5–15)
BUN: 8 mg/dL (ref 8–23)
CO2: 26 mmol/L (ref 22–32)
Calcium: 8 mg/dL — ABNORMAL LOW (ref 8.9–10.3)
Chloride: 101 mmol/L (ref 98–111)
Creatinine, Ser: 0.55 mg/dL (ref 0.44–1.00)
GFR calc Af Amer: >60 mL/min (ref >60)
GFR calc non Af Amer: >60 mL/min (ref >60)
Glucose, Bld: 95 mg/dL (ref 70–99)
Potassium: 3.4 mmol/L — ABNORMAL LOW (ref 3.5–5.1)
Sodium: 132 mmol/L — ABNORMAL LOW (ref 135–145)

## 2018-02-08 LAB — CBC
HCT: 35.8 % (ref 35.0–47.0)
Hemoglobin: 12.7 g/dL (ref 12.0–16.0)
MCH: 29.4 pg (ref 26.0–34.0)
MCHC: 35.5 g/dL (ref 32.0–36.0)
MCV: 82.8 fL (ref 80.0–100.0)
Platelets: 224 10*3/uL (ref 150–440)
RBC: 4.33 MIL/uL (ref 3.80–5.20)
RDW: 14.3 % (ref 11.5–14.5)
WBC: 5.7 10*3/uL (ref 3.6–11.0)

## 2018-02-08 MED ORDER — LOSARTAN POTASSIUM 100 MG PO TABS: 100.0000 mg | ORAL_TABLET | Freq: Every day | ORAL | 2 refills | Status: DC

## 2018-02-08 MED ORDER — SODIUM CHLORIDE 0.9% FLUSH: 3.0000 mL | Freq: Two times a day (BID) | INTRAVENOUS | Status: DC

## 2018-02-08 MED ORDER — POTASSIUM CHLORIDE CRYS ER 20 MEQ PO TBCR
40.0000 meq | EXTENDED_RELEASE_TABLET | Freq: Once | ORAL | Status: AC
  Administered 2018-02-08: 08:00:00 40 meq via ORAL
  Filled 2018-02-08: qty 2

## 2018-02-08 MED ORDER — LOSARTAN POTASSIUM 50 MG PO TABS
100.0000 mg | ORAL_TABLET | Freq: Every day | ORAL | Status: DC
  Administered 2018-02-08: 100 mg via ORAL
  Filled 2018-02-08: qty 2

## 2018-02-08 NOTE — Progress Notes (Signed)
Pt alert, remains confused at baseline. VSS. Labs normalizing with no cardiac events. Eating well. Walks with walker. Oral AVS instructions printed out and will be sent with discharge packet with yellow DNR form in packet. Attempted to call report to [facility] with no answer x 3.

## 2018-02-08 NOTE — Discharge Instructions (Signed)
Hyponatremia °Hyponatremia is when the amount of salt (sodium) in your blood is too low. When sodium levels are low, your cells absorb extra water and they swell. The swelling happens throughout the body, but it mostly affects the brain. °What are the causes? °This condition may be caused by: °· Heart, kidney, or liver problems. °· Thyroid problems. °· Adrenal gland problems. °· Metabolic conditions, such as syndrome of inappropriate antidiuretic hormone (SIADH). °· Severe vomiting and diarrhea. °· Certain medicines or illegal drugs. °· Dehydration. °· Drinking too much water. °· Eating a diet that is low in sodium. °· Large burns on your body. °· Sweating. ° °What increases the risk? °This condition is more likely to develop in people who: °· Have long-term (chronic) kidney disease. °· Have heart failure. °· Have a medical condition that causes frequent or excessive diarrhea. °· Have metabolic conditions, such as Addison disease or SIADH. °· Take certain medicines that affect the sodium and fluid balance in the blood. Some of these medicine types include: °? Diuretics. °? NSAIDs. °? Some opioid pain medicines. °? Some antidepressants. °? Some seizure prevention medicines. ° °What are the signs or symptoms? °Symptoms of this condition include: °· Nausea and vomiting. °· Confusion. °· Lethargy. °· Agitation. °· Headache. °· Seizures. °· Unconsciousness. °· Appetite loss. °· Muscle weakness and cramping. °· Feeling weak or light-headed. °· Having a rapid heart rate. °· Fainting, in severe cases. ° °How is this diagnosed? °This condition is diagnosed with a medical history and physical exam. You will also have other tests, including: °· Blood tests. °· Urine tests. ° °How is this treated? °Treatment for this condition depends on the cause. Treatment may include: °· Fluids given through an IV tube that is inserted into one of your veins. °· Medicines to correct the sodium imbalance. If medicines are causing the  condition, the medicines will need to be adjusted. °· Limiting water or fluid intake to get the correct sodium balance. ° °Follow these instructions at home: °· Take medicines only as directed by your health care provider. Many medicines can make this condition worse. Talk with your health care provider about any medicines that you are currently taking. °· Carefully follow a recommended diet as directed by your health care provider. °· Carefully follow instructions from your health care provider about fluid restrictions. °· Keep all follow-up visits as directed by your health care provider. This is important. °· Do not drink alcohol. °Contact a health care provider if: °· You develop worsening nausea, fatigue, headache, confusion, or weakness. °· Your symptoms go away and then return. °· You have problems following the recommended diet. °Get help right away if: °· You have a seizure. °· You faint. °· You have ongoing diarrhea or vomiting. °This information is not intended to replace advice given to you by your health care provider. Make sure you discuss any questions you have with your health care provider. °Document Released: 06/14/2002 Document Revised: 11/30/2015 Document Reviewed: 07/14/2014 °Elsevier Interactive Patient Education © 2018 [employer]. ° °

## 2018-02-08 NOTE — Progress Notes (Signed)
Report called to [place] at [facility] with pt accepted. RTC to [place], dgt with update given and advised of discharge back to [facility] in progress with this agreeable. Pt prepared for transport.

## 2018-02-08 NOTE — Progress Notes (Signed)
[facility] [facility] here with pt released to their care for EMS transport to [facility] with discharge packet including DNR. Pt condition stable.

## 2018-02-08 NOTE — Discharge Summary (Signed)
[place] [facility] [place] NAME: Robin Lynn    MR#:  [id number]  DATE OF BIRTH:  [birth year]  DATE OF ADMISSION:  02/06/2018   ADMITTING PHYSICIAN: Gorden Harms, MD  DATE OF DISCHARGE:  02/08/18  PRIMARY CARE PHYSICIAN: Venia Carbon, MD   ADMISSION DIAGNOSIS:   Syncope and collapse [R55] Hyponatremia [E87.1]  DISCHARGE DIAGNOSIS:   Active Problems:   Syncope and collapse   SECONDARY DIAGNOSIS:   Past Medical History:  Diagnosis Date  . Cancer ([place])   . History of breast cancer    left  . HLD (hyperlipidemia)   . HTN (hypertension)   . Urinary incontinence     HOSPITAL COURSE:   84 year old female with past medical history significant for hypertension, history of breast cancer, urinary incontinence was brought in secondary to weakness and a syncopal episode  1.  Syncope-concern for vasovagal syncope.  However cardiac causes cannot be ruled out -Telemetry monitor here with no arrhythmias.  Appreciate cardiology consult. -Echocardiogram with no acute findings. -30-day cardiac monitor recommended by cardiology.  Outpatient set up -Carotid Dopplers showing moderate amount of left-sided atherosclerotic disease which is stable from 2016.  Outpatient follow-up recommended -CT of the head with no acute findings but chronic microvascular ischemic changes -Physical therapy recommended home health  2.  Elevated troponin-could be demand ischemia.  Cardiology consult appreciated -Echocardiogram with EF of 55 to 65%.  Mild aortic regurgitation and mild mitral stenosis noted.  No other wall motion abnormality identified -Outpatient follow-up with cardiology recommended  3.  Hyponatremia-improved after receiving gentle hydration.  Discontinue hydrochlorothiazide  4.  Hypertension on losartan- dose increased -Hydrochlorothiazide being discontinued  5.  Depression-on Lexapro   Updated daughter Ms. Robin Lynn over the phone  yesterday. She is patient HCPOA -Discharge home today with home health    DISCHARGE CONDITIONS:   Guarded  CONSULTS OBTAINED:   Treatment Team:  Thompson Grayer, MD  DRUG ALLERGIES:   No Known Allergies DISCHARGE MEDICATIONS:   Allergies as of 02/08/2018   No Known Allergies     Medication List    STOP taking these medications   losartan-hydrochlorothiazide 50-12.5 MG tablet Commonly known as:  HYZAAR     TAKE these medications   acetaminophen 325 MG tablet Commonly known as:  TYLENOL Take 325-650 mg by mouth every 4 (four) hours as needed.   alum & mag hydroxide-simeth 200-200-20 MG/5ML suspension Commonly known as:  MAALOX/MYLANTA Take 30 mLs by mouth every 4 (four) hours as needed for indigestion or heartburn.   co-enzyme Q-10 30 MG capsule Take 1 capsule by mouth daily.   escitalopram 10 MG tablet Commonly known as:  LEXAPRO Take 10 mg by mouth daily.   guaiFENesin-dextromethorphan 100-10 MG/5ML syrup Commonly known as:  ROBITUSSIN DM Take 10 mLs by mouth every 4 (four) hours as needed for cough.   KAOPECTATE 262 MG/15ML suspension Generic drug:  bismuth subsalicylate Take 10 mLs by mouth 3 (three) times daily as needed.   losartan 100 MG tablet Commonly known as:  COZAAR Take 1 tablet (100 mg total) by mouth daily. Start taking on:  02/09/2018   magnesium hydroxide 400 MG/5ML suspension Commonly known as:  MILK OF MAGNESIA Take 30 mLs by mouth daily as needed for mild constipation.   nystatin powder Commonly known as:  MYCOSTATIN/NYSTOP Apply 1 g topically 2 (two) times daily. To folds and under the breasts   therapeutic multivitamin-minerals tablet Take 1 tablet by mouth daily.  Vitamin D (Cholecalciferol) 1000 units Caps Take by mouth.   vitamin E 400 UNIT capsule Take 400 Units by mouth daily.        DISCHARGE INSTRUCTIONS:   1.  PCP follow-up in 1 to 2 weeks 2.  Cardiology follow-up within 1 to 2 days for a 30-day event  monitor 3.  Vascular follow-up in 3 to 6 months for right carotid stenosis  DIET:   Cardiac diet  ACTIVITY:   Activity as tolerated  OXYGEN:   Home Oxygen: No.  Oxygen Delivery: room air  DISCHARGE LOCATION:   Assisted Living   If you experience worsening of your admission symptoms, develop shortness of breath, life threatening emergency, suicidal or homicidal thoughts you must seek medical attention immediately by calling 911 or calling your MD immediately  if symptoms less severe.  You Must read complete instructions/literature along with all the possible adverse reactions/side effects for all the Medicines you take and that have been prescribed to you. Take any new Medicines after you have completely understood and accpet all the possible adverse reactions/side effects.   Please note  You were cared for by a hospitalist during your hospital stay. If you have any questions about your discharge medications or the care you received while you were in the hospital after you are discharged, you can call the unit and asked to speak with the hospitalist on call if the hospitalist that took care of you is not available. Once you are discharged, your primary care physician will handle any further medical issues. Please note that NO REFILLS for any discharge medications will be authorized once you are discharged, as it is imperative that you return to your primary care physician (or establish a relationship with a primary care physician if you do not have one) for your aftercare needs so that they can reassess your need for medications and monitor your lab values.    On the day of Discharge:  VITAL SIGNS:   Blood pressure (!) 175/80, pulse 86, temperature 98.3 F (36.8 C), temperature source Oral, resp. rate 20, height 5\' 5"  (1.651 m), weight 63.6 kg (140 lb 3.4 oz), SpO2 97 %.  PHYSICAL EXAMINATION:    GENERAL:  84 y.o.-year-old ill nourished patient sitting in the bed with no acute  distress.  EYES: Pupils equal, round, reactive to light and accommodation. No scleral icterus. Extraocular muscles intact.  HEENT: Head atraumatic, normocephalic. Oropharynx and nasopharynx clear.  NECK:  Supple, no jugular venous distention. No thyroid enlargement, no tenderness.  LUNGS: Normal breath sounds bilaterally, no wheezing, rales,rhonchi or crepitation. No use of accessory muscles of respiration.  Decreased bibasilar breath sounds. CARDIOVASCULAR: S1, S2 normal. No rubs, or gallops.  3/6 systolic murmur is present ABDOMEN: Soft, nontender, nondistended. Bowel sounds present. No organomegaly or mass.  EXTREMITIES: No pedal edema, cyanosis, or clubbing.  NEUROLOGIC: Cranial nerves II through XII are intact. Muscle strength 5/5 in all extremities. Sensation intact. Gait not checked.  PSYCHIATRIC: The patient is alert and oriented x 3.  Intermittent confusion and forgetfulness noted SKIN: No obvious rash, lesion, or ulcer.      DATA REVIEW:   CBC Recent Labs  Lab 02/08/18 0701  WBC 5.7  HGB 12.7  HCT 35.8  PLT 224    Chemistries  Recent Labs  Lab 02/03/18 1307  02/08/18 0701  NA 130*   < > 132*  K 3.9   < > 3.4*  CL 97*   < > 101  CO2 26   < >  26  GLUCOSE 154*   < > 95  BUN 14   < > 8  CREATININE 0.58   < > 0.55  CALCIUM 8.6*   < > 8.0*  AST 25  --   --   ALT 12  --   --   ALKPHOS 61  --   --   BILITOT 0.7  --   --    < > = values in this interval not displayed.     Microbiology Results  Results for orders placed or performed during the hospital encounter of 02/03/18  Urine culture     Status: None   Collection Time: 02/03/18  2:28 PM  Result Value Ref Range Status   Specimen Description   Final    URINE, RANDOM Performed at [facility], [address] [place], [place] [address]    Special Requests   Final    NONE Performed at [facility], [address], [place], [place] [address]    Culture   Final    NO GROWTH Performed at  [facility], [place] [address] [place], [place] [address]    Report Status 02/04/2018 FINAL  Final    RADIOLOGY:  No results found.   Management plans discussed with the patient, family and they are in agreement.  CODE STATUS:     Code Status Orders  (From admission, onward)        Start     Ordered   02/06/18 1702  Do not attempt resuscitation (DNR)  Continuous    Question Answer Comment  In the event of cardiac or respiratory ARREST Do not call a "code blue"   In the event of cardiac or respiratory ARREST Do not perform Intubation, CPR, defibrillation or ACLS   In the event of cardiac or respiratory ARREST Use medication by any route, position, wound care, and other measures to relive pain and suffering. May use oxygen, suction and manual treatment of airway obstruction as needed for comfort.      02/06/18 1701    Code Status History    Date Active Date Inactive Code Status Order ID Comments User Context   02/06/2018 1615 02/06/2018 1701 Full Code [id number]  Gorden Harms, MD ED   03/23/2015 2223 03/24/2015 1607 DNR [id number]  Lance Coon, MD Inpatient    Advance Directive Documentation     Most Recent Value  Type of Advance Directive  Healthcare Power of [place], Living will, Out of facility DNR (pink MOST or yellow form)  Pre-existing out of facility DNR order (yellow form or pink MOST form)  Yellow form placed in chart (order not valid for inpatient use)  "MOST" Form in Place?  -      TOTAL TIME TAKING CARE OF THIS PATIENT: 38  minutes.    Gladstone Lighter M.D on 02/08/2018 at 10:19 AM  Between 7am to 6pm - Pager - [phone]  After 6pm go to [email or web address] - Proofreader  [employer]  Hospitalists  Office  [phone]  CC: Primary care physician; Venia Carbon, MD   Note: This dictation was prepared with Dragon dictation along with smaller phrase technology. Any transcriptional errors that result from this process are  unintentional.

## 2018-02-08 NOTE — Clinical Social Work Note (Signed)
The patient will discharge today to [facility] for respite care. The patient and facility are aware and in agreement. The CSW attempted to contact the patient's daughter with no ability to leave a VM as the phone is currently going to an "unavailable" message. The CSW will deliver the discharge packet as soon as possible and has sent all needed documentation to the facility. The CSW will sign off once the packet has been delivered. Please consult should needs arise.  Robin Lynn, MSW, [name]  [phone]

## 2018-02-09 ENCOUNTER — Telehealth: Payer: Self-pay | Admitting: *Deleted

## 2018-02-09 NOTE — Telephone Encounter (Signed)
-----   Message from Rise Mu, PA-C sent at 02/08/2018 11:44 AM EDT -----   ----- Message ----- From: Thompson Grayer, MD Sent: 02/08/2018  11:26 AM To: Rise Mu, PA-C  Please arrange 30 day monitor for syncope and follow-up in 6 weeks with Dr Caryl Comes in [place].

## 2018-02-11 ENCOUNTER — Telehealth: Payer: Self-pay | Admitting: *Deleted

## 2018-02-11 NOTE — Telephone Encounter (Signed)
duplicate

## 2018-02-11 NOTE — Telephone Encounter (Signed)
I attempted to contact the patient to discuss setting her up for a 30-day heart monitor:  - home #- no answer/ no voicemail - cell #- voice mail sounds like it for someone named "Kae Heller" - I did not leave a message as there is no one by that name listed on her DPR/ contacts - attempted to call Liz Malady ([place]) at [phone]- message received that the caller is not accepting calls at this time  Will try to call back at a later time.

## 2018-02-11 NOTE — Telephone Encounter (Signed)
-----   Message from Rise Mu, PA-C sent at 02/08/2018 11:44 AM EDT -----   ----- Message ----- From: Thompson Grayer, MD Sent: 02/08/2018  11:26 AM To: Rise Mu, PA-C  Please arrange 30 day monitor for syncope and follow-up in 6 weeks with Dr Caryl Comes in [place].

## 2018-02-12 ENCOUNTER — Encounter: Payer: Self-pay | Admitting: *Deleted

## 2018-02-12 DIAGNOSIS — F39 Unspecified mood [affective] disorder: Secondary | ICD-10-CM | POA: Diagnosis not present

## 2018-02-12 DIAGNOSIS — F0150 Vascular dementia without behavioral disturbance: Secondary | ICD-10-CM

## 2018-02-12 DIAGNOSIS — I10 Essential (primary) hypertension: Secondary | ICD-10-CM | POA: Diagnosis not present

## 2018-02-12 DIAGNOSIS — I639 Cerebral infarction, unspecified: Secondary | ICD-10-CM

## 2018-02-12 DIAGNOSIS — R55 Syncope and collapse: Secondary | ICD-10-CM | POA: Diagnosis not present

## 2018-02-12 NOTE — Telephone Encounter (Signed)
I attempted to call the patient  - home #- no answer/ no voice mail after multiple rings - cell #- no answer/ voice mail identifies "this is Robin Lynn" - attempted to call Liz Malady at [phone]- message received that the caller is not accepting calls at this time.   Letter mailed to the patient to please contact the office.

## 2018-02-16 DIAGNOSIS — I10 Essential (primary) hypertension: Secondary | ICD-10-CM | POA: Diagnosis not present

## 2018-02-16 DIAGNOSIS — I639 Cerebral infarction, unspecified: Secondary | ICD-10-CM | POA: Diagnosis not present

## 2018-02-16 DIAGNOSIS — F0150 Vascular dementia without behavioral disturbance: Secondary | ICD-10-CM | POA: Diagnosis not present

## 2018-02-16 DIAGNOSIS — F39 Unspecified mood [affective] disorder: Secondary | ICD-10-CM | POA: Diagnosis not present

## 2018-04-08 DIAGNOSIS — F0150 Vascular dementia without behavioral disturbance: Secondary | ICD-10-CM | POA: Diagnosis not present

## 2018-04-08 DIAGNOSIS — I69398 Other sequelae of cerebral infarction: Secondary | ICD-10-CM

## 2018-04-08 DIAGNOSIS — F39 Unspecified mood [affective] disorder: Secondary | ICD-10-CM | POA: Diagnosis not present

## 2018-04-08 DIAGNOSIS — I10 Essential (primary) hypertension: Secondary | ICD-10-CM | POA: Diagnosis not present

## 2018-05-13 DIAGNOSIS — F0150 Vascular dementia without behavioral disturbance: Secondary | ICD-10-CM

## 2018-05-13 DIAGNOSIS — I10 Essential (primary) hypertension: Secondary | ICD-10-CM

## 2018-05-13 DIAGNOSIS — I69398 Other sequelae of cerebral infarction: Secondary | ICD-10-CM

## 2018-05-13 DIAGNOSIS — F39 Unspecified mood [affective] disorder: Secondary | ICD-10-CM | POA: Diagnosis not present

## 2018-06-12 DIAGNOSIS — I6930 Unspecified sequelae of cerebral infarction: Secondary | ICD-10-CM | POA: Diagnosis not present

## 2018-06-12 DIAGNOSIS — F39 Unspecified mood [affective] disorder: Secondary | ICD-10-CM

## 2018-06-12 DIAGNOSIS — F0150 Vascular dementia without behavioral disturbance: Secondary | ICD-10-CM

## 2018-06-17 DIAGNOSIS — H02409 Unspecified ptosis of unspecified eyelid: Secondary | ICD-10-CM | POA: Diagnosis not present

## 2018-06-17 DIAGNOSIS — B309 Viral conjunctivitis, unspecified: Secondary | ICD-10-CM

## 2018-07-07 DIAGNOSIS — T7840XA Allergy, unspecified, initial encounter: Secondary | ICD-10-CM | POA: Diagnosis not present

## 2018-08-12 DIAGNOSIS — I69398 Other sequelae of cerebral infarction: Secondary | ICD-10-CM

## 2018-08-12 DIAGNOSIS — F0150 Vascular dementia without behavioral disturbance: Secondary | ICD-10-CM

## 2018-08-12 DIAGNOSIS — F39 Unspecified mood [affective] disorder: Secondary | ICD-10-CM

## 2018-08-14 DIAGNOSIS — B351 Tinea unguium: Secondary | ICD-10-CM

## 2018-09-23 DIAGNOSIS — B029 Zoster without complications: Secondary | ICD-10-CM

## 2018-10-08 DIAGNOSIS — F0150 Vascular dementia without behavioral disturbance: Secondary | ICD-10-CM

## 2018-10-08 DIAGNOSIS — I6930 Unspecified sequelae of cerebral infarction: Secondary | ICD-10-CM

## 2018-10-08 DIAGNOSIS — F39 Unspecified mood [affective] disorder: Secondary | ICD-10-CM

## 2018-10-30 DIAGNOSIS — B029 Zoster without complications: Secondary | ICD-10-CM

## 2018-11-18 DIAGNOSIS — B351 Tinea unguium: Secondary | ICD-10-CM

## 2018-12-01 DIAGNOSIS — L989 Disorder of the skin and subcutaneous tissue, unspecified: Secondary | ICD-10-CM

## 2018-12-09 DIAGNOSIS — F0150 Vascular dementia without behavioral disturbance: Secondary | ICD-10-CM

## 2018-12-09 DIAGNOSIS — I69398 Other sequelae of cerebral infarction: Secondary | ICD-10-CM

## 2018-12-09 DIAGNOSIS — F39 Unspecified mood [affective] disorder: Secondary | ICD-10-CM

## 2018-12-28 DIAGNOSIS — M25532 Pain in left wrist: Secondary | ICD-10-CM

## 2018-12-28 DIAGNOSIS — M25432 Effusion, left wrist: Secondary | ICD-10-CM

## 2019-01-25 DIAGNOSIS — B029 Zoster without complications: Secondary | ICD-10-CM

## 2019-02-09 DIAGNOSIS — I6930 Unspecified sequelae of cerebral infarction: Secondary | ICD-10-CM

## 2019-02-09 DIAGNOSIS — F0150 Vascular dementia without behavioral disturbance: Secondary | ICD-10-CM

## 2019-02-09 DIAGNOSIS — I10 Essential (primary) hypertension: Secondary | ICD-10-CM

## 2019-02-09 DIAGNOSIS — F39 Unspecified mood [affective] disorder: Secondary | ICD-10-CM

## 2019-02-10 DIAGNOSIS — B351 Tinea unguium: Secondary | ICD-10-CM

## 2019-03-24 DIAGNOSIS — S80812A Abrasion, left lower leg, initial encounter: Secondary | ICD-10-CM

## 2019-04-16 DIAGNOSIS — I10 Essential (primary) hypertension: Secondary | ICD-10-CM

## 2019-04-16 DIAGNOSIS — F39 Unspecified mood [affective] disorder: Secondary | ICD-10-CM

## 2019-04-16 DIAGNOSIS — B029 Zoster without complications: Secondary | ICD-10-CM

## 2019-04-16 DIAGNOSIS — I69359 Hemiplegia and hemiparesis following cerebral infarction affecting unspecified side: Secondary | ICD-10-CM

## 2019-04-16 DIAGNOSIS — F0150 Vascular dementia without behavioral disturbance: Secondary | ICD-10-CM

## 2019-04-28 DIAGNOSIS — H02105 Unspecified ectropion of left lower eyelid: Secondary | ICD-10-CM

## 2019-05-25 DIAGNOSIS — H579 Unspecified disorder of eye and adnexa: Secondary | ICD-10-CM

## 2019-06-10 DIAGNOSIS — F0150 Vascular dementia without behavioral disturbance: Secondary | ICD-10-CM

## 2019-06-10 DIAGNOSIS — I10 Essential (primary) hypertension: Secondary | ICD-10-CM

## 2019-06-10 DIAGNOSIS — I6930 Unspecified sequelae of cerebral infarction: Secondary | ICD-10-CM

## 2019-08-11 DIAGNOSIS — I69359 Hemiplegia and hemiparesis following cerebral infarction affecting unspecified side: Secondary | ICD-10-CM

## 2019-08-11 DIAGNOSIS — F0150 Vascular dementia without behavioral disturbance: Secondary | ICD-10-CM

## 2019-08-11 DIAGNOSIS — I10 Essential (primary) hypertension: Secondary | ICD-10-CM

## 2019-10-21 DIAGNOSIS — I6930 Unspecified sequelae of cerebral infarction: Secondary | ICD-10-CM

## 2019-10-21 DIAGNOSIS — I10 Essential (primary) hypertension: Secondary | ICD-10-CM

## 2019-10-21 DIAGNOSIS — F0150 Vascular dementia without behavioral disturbance: Secondary | ICD-10-CM

## 2019-10-22 DIAGNOSIS — B351 Tinea unguium: Secondary | ICD-10-CM

## 2019-12-08 DIAGNOSIS — B029 Zoster without complications: Secondary | ICD-10-CM

## 2019-12-08 DIAGNOSIS — I69359 Hemiplegia and hemiparesis following cerebral infarction affecting unspecified side: Secondary | ICD-10-CM

## 2019-12-08 DIAGNOSIS — F0150 Vascular dementia without behavioral disturbance: Secondary | ICD-10-CM

## 2019-12-08 DIAGNOSIS — M25532 Pain in left wrist: Secondary | ICD-10-CM

## 2019-12-08 DIAGNOSIS — I10 Essential (primary) hypertension: Secondary | ICD-10-CM

## 2019-12-24 DIAGNOSIS — B351 Tinea unguium: Secondary | ICD-10-CM

## 2020-01-28 DIAGNOSIS — B351 Tinea unguium: Secondary | ICD-10-CM

## 2020-04-12 DIAGNOSIS — F0150 Vascular dementia without behavioral disturbance: Secondary | ICD-10-CM | POA: Diagnosis not present

## 2020-04-12 DIAGNOSIS — B029 Zoster without complications: Secondary | ICD-10-CM | POA: Diagnosis not present

## 2020-04-12 DIAGNOSIS — M25532 Pain in left wrist: Secondary | ICD-10-CM | POA: Diagnosis not present

## 2020-04-12 DIAGNOSIS — B351 Tinea unguium: Secondary | ICD-10-CM

## 2020-04-12 DIAGNOSIS — I69359 Hemiplegia and hemiparesis following cerebral infarction affecting unspecified side: Secondary | ICD-10-CM | POA: Diagnosis not present

## 2020-05-12 DIAGNOSIS — B351 Tinea unguium: Secondary | ICD-10-CM

## 2020-06-15 DIAGNOSIS — I6930 Unspecified sequelae of cerebral infarction: Secondary | ICD-10-CM

## 2020-06-15 DIAGNOSIS — I10 Essential (primary) hypertension: Secondary | ICD-10-CM

## 2020-06-15 DIAGNOSIS — M159 Polyosteoarthritis, unspecified: Secondary | ICD-10-CM

## 2020-06-15 DIAGNOSIS — F0150 Vascular dementia without behavioral disturbance: Secondary | ICD-10-CM

## 2020-08-09 DIAGNOSIS — B029 Zoster without complications: Secondary | ICD-10-CM

## 2020-08-09 DIAGNOSIS — M1990 Unspecified osteoarthritis, unspecified site: Secondary | ICD-10-CM

## 2020-08-09 DIAGNOSIS — I10 Essential (primary) hypertension: Secondary | ICD-10-CM

## 2020-08-09 DIAGNOSIS — I69354 Hemiplegia and hemiparesis following cerebral infarction affecting left non-dominant side: Secondary | ICD-10-CM

## 2020-08-09 DIAGNOSIS — F0150 Vascular dementia without behavioral disturbance: Secondary | ICD-10-CM

## 2020-10-10 DIAGNOSIS — F39 Unspecified mood [affective] disorder: Secondary | ICD-10-CM

## 2020-10-10 DIAGNOSIS — I6930 Unspecified sequelae of cerebral infarction: Secondary | ICD-10-CM

## 2020-10-10 DIAGNOSIS — M159 Polyosteoarthritis, unspecified: Secondary | ICD-10-CM

## 2020-10-10 DIAGNOSIS — F0150 Vascular dementia without behavioral disturbance: Secondary | ICD-10-CM

## 2020-12-06 DIAGNOSIS — I69359 Hemiplegia and hemiparesis following cerebral infarction affecting unspecified side: Secondary | ICD-10-CM | POA: Diagnosis not present

## 2020-12-06 DIAGNOSIS — F39 Unspecified mood [affective] disorder: Secondary | ICD-10-CM | POA: Diagnosis not present

## 2020-12-06 DIAGNOSIS — M1990 Unspecified osteoarthritis, unspecified site: Secondary | ICD-10-CM | POA: Diagnosis not present

## 2020-12-06 DIAGNOSIS — F0150 Vascular dementia without behavioral disturbance: Secondary | ICD-10-CM | POA: Diagnosis not present

## 2020-12-06 DIAGNOSIS — I10 Essential (primary) hypertension: Secondary | ICD-10-CM

## 2020-12-06 DIAGNOSIS — B029 Zoster without complications: Secondary | ICD-10-CM

## 2020-12-09 ENCOUNTER — Encounter: Payer: Self-pay | Admitting: Emergency Medicine

## 2020-12-09 ENCOUNTER — Emergency Department
Admission: EM | Admit: 2020-12-09 | Discharge: 2020-12-09 | Disposition: A | Discharge status: Home / Self Care | Payer: Medicare Other | Attending: Emergency Medicine | Admitting: Emergency Medicine

## 2020-12-09 ENCOUNTER — Emergency Department: Payer: Medicare Other

## 2020-12-09 ENCOUNTER — Other Ambulatory Visit: Payer: Self-pay

## 2020-12-09 DIAGNOSIS — Z79899 Other long term (current) drug therapy: Secondary | ICD-10-CM | POA: Insufficient documentation

## 2020-12-09 DIAGNOSIS — S0001XA Abrasion of scalp, initial encounter: Secondary | ICD-10-CM | POA: Diagnosis not present

## 2020-12-09 DIAGNOSIS — I10 Essential (primary) hypertension: Secondary | ICD-10-CM | POA: Insufficient documentation

## 2020-12-09 DIAGNOSIS — S0990XA Unspecified injury of head, initial encounter: Secondary | ICD-10-CM | POA: Diagnosis present

## 2020-12-09 DIAGNOSIS — W19XXXA Unspecified fall, initial encounter: Secondary | ICD-10-CM | POA: Diagnosis not present

## 2020-12-09 DIAGNOSIS — Z853 Personal history of malignant neoplasm of breast: Secondary | ICD-10-CM | POA: Diagnosis not present

## 2020-12-09 HISTORY — DX: Vascular dementia, unspecified severity, without behavioral disturbance, psychotic disturbance, mood disturbance, and anxiety: F01.50

## 2020-12-09 LAB — COMPREHENSIVE METABOLIC PANEL
ALT: 14 U/L (ref 0–44)
AST: 23 U/L (ref 15–41)
Albumin: 4 g/dL (ref 3.5–5.0)
Alkaline Phosphatase: 114 U/L (ref 38–126)
Anion gap: 9 (ref 5–15)
BUN: 15 mg/dL (ref 8–23)
CO2: 26 mmol/L (ref 22–32)
Calcium: 9.1 mg/dL (ref 8.9–10.3)
Chloride: 102 mmol/L (ref 98–111)
Creatinine, Ser: 0.84 mg/dL (ref 0.44–1.00)
GFR, Estimated: >60 mL/min (ref >60)
Glucose, Bld: 111 mg/dL — ABNORMAL HIGH (ref 70–99)
Potassium: 4.3 mmol/L (ref 3.5–5.1)
Sodium: 137 mmol/L (ref 135–145)
Total Bilirubin: 0.2 mg/dL — ABNORMAL LOW (ref 0.3–1.2)
Total Protein: 6.8 g/dL (ref 6.5–8.1)

## 2020-12-09 LAB — URINALYSIS, COMPLETE (UACMP) WITH MICROSCOPIC
Bacteria, UA: NONE SEEN
Bilirubin Urine: NEGATIVE
Glucose, UA: NEGATIVE mg/dL
Hgb urine dipstick: NEGATIVE
Ketones, ur: NEGATIVE mg/dL
Nitrite: NEGATIVE
Protein, ur: NEGATIVE mg/dL
Specific Gravity, Urine: 1.01 (ref 1.005–1.030)
pH: 7 (ref 5.0–8.0)

## 2020-12-09 LAB — CBC
HCT: 44.5 % (ref 36.0–46.0)
Hemoglobin: 14.5 g/dL (ref 12.0–15.0)
MCH: 28.8 pg (ref 26.0–34.0)
MCHC: 32.6 g/dL (ref 30.0–36.0)
MCV: 88.5 fL (ref 80.0–100.0)
Platelets: 296 10*3/uL (ref 150–400)
RBC: 5.03 MIL/uL (ref 3.87–5.11)
RDW: 14.1 % (ref 11.5–15.5)
WBC: 9 10*3/uL (ref 4.0–10.5)
nRBC: 0 % (ref 0.0–0.2)

## 2020-12-09 NOTE — ED Notes (Signed)
Patient moves all extremities freely. Patient denies pain when turning her head. Patient is oriented to self. Patient is cooperative.

## 2020-12-09 NOTE — ED Triage Notes (Signed)
Pt in via EMS from [place] with c/o fall. Facility reports no LOC. Pt with bandage around her head. 154/78, HR 88. Pt akes 81mg  aspirin daily.

## 2020-12-09 NOTE — ED Notes (Signed)
[facility] to transport to [facility]

## 2020-12-09 NOTE — ED Notes (Signed)
Report given to Estate manager/land agent at [place], [place]. Patient is to transported by EMS.

## 2020-12-09 NOTE — ED Notes (Signed)
Bed alarm placed on patient's bed. Patient is alert and oriented to self.

## 2020-12-09 NOTE — ED Triage Notes (Signed)
Pt with noted laceration to posterior head, when asked what happened pt states "I don't give a shit what happened", pt then states "I think I passed out". Pt alert, disoriented to time, disoriented to situation, oriented to person and place.

## 2020-12-09 NOTE — ED Provider Notes (Signed)
[facility] Emergency Department Provider Note ____________________________________________   Event Date/Time   First MD Initiated Contact with Patient 12/09/20 1751     (approximate)  I have reviewed the triage vital signs and the nursing notes.   HISTORY  Chief Complaint Fall  HPI Robin Lynn is a 87 y.o. female with history of vascular dementia, urinary incontinence, hypertension, and other history as listed below presents to the emergency department for treatment and evaluation after falling.  She is a resident at [facility].  Per EMS, fall was witnessed and there was no loss of consciousness.  She does have a laceration/abrasion to the back of her head.  She takes 81 mg aspirin daily but otherwise no blood thinners.  Patient unable to recall exactly what happened but denies pain at this time.  She is alert to person only.         Past Medical History:  Diagnosis Date  . Cancer ([place])   . History of breast cancer    left  . HLD (hyperlipidemia)   . HTN (hypertension)   . Urinary incontinence   . Vascular dementia without behavioral disturbance [facility])     Patient Active Problem List   Diagnosis Date Noted  . Syncope and collapse 02/06/2018  . History of breast cancer 12/05/2017  . Advance directive discussed with patient 08/21/2016  . Vascular dementia without behavioral disturbance ([place]) 02/14/2016  . HTN (hypertension) 03/23/2015  . HLD (hyperlipidemia) 03/23/2015    Past Surgical History:  Procedure Laterality Date  . APPENDECTOMY    . BREAST LUMPECTOMY Left    with radiation    Prior to Admission medications   Medication Sig Start Date End Date Taking? Authorizing Provider  acetaminophen (TYLENOL) 325 MG tablet Take 325-650 mg by mouth every 4 (four) hours as needed.    [provider]  alum & mag hydroxide-simeth (MAALOX/MYLANTA) 200-200-20 MG/5ML suspension Take 30 mLs by mouth every 4 (four) hours as needed for  indigestion or heartburn.    [provider]  bismuth subsalicylate (KAOPECTATE) 262 MG/15ML suspension Take 10 mLs by mouth 3 (three) times daily as needed.    [provider]  co-enzyme Q-10 30 MG capsule Take 1 capsule by mouth daily.    [provider]  escitalopram (LEXAPRO) 10 MG tablet Take 10 mg by mouth daily.    [provider]  guaiFENesin-dextromethorphan (ROBITUSSIN DM) 100-10 MG/5ML syrup Take 10 mLs by mouth every 4 (four) hours as needed for cough.    [provider]  losartan (COZAAR) 100 MG tablet Take 1 tablet (100 mg total) by mouth daily. 02/09/18   Gladstone Lighter, MD  magnesium hydroxide (MILK OF MAGNESIA) 400 MG/5ML suspension Take 30 mLs by mouth daily as needed for mild constipation.    [provider]  nystatin (MYCOSTATIN/NYSTOP) powder Apply 1 g topically 2 (two) times daily. To folds and under the breasts    [provider]  therapeutic multivitamin-minerals (THERAGRAN-M) tablet Take 1 tablet by mouth daily.    [provider]  Vitamin D, Cholecalciferol, 1000 units CAPS Take by mouth.    [provider]  vitamin E 400 UNIT capsule Take 400 Units by mouth daily.    [provider]    Allergies Patient has no known allergies.  Family History  Problem Relation Age of Onset  . CAD Father   . Heart attack Father   . Cancer Other   . CAD Sister   .  Diabetes Neg Hx     Social History Social History   Tobacco Use  . Smoking status: Never Smoker  . Smokeless tobacco: Never Used  Vaping Use  . Vaping Use: Never used  Substance Use Topics  . Alcohol use: Yes    Alcohol/week: 1.0 standard drink    Types: 1 Glasses of wine per week  . Drug use: No    Review of Systems  Level 5 Caveat: Dementia. ____________________________________________   PHYSICAL EXAM:  VITAL SIGNS: ED Triage Vitals  Enc Vitals Group     BP 12/09/20 1622 (!) 149/75     Pulse Rate 12/09/20  1622 91     Resp 12/09/20 1622 16     Temp 12/09/20 1622 98.1 F (36.7 C)     Temp Source 12/09/20 1622 Oral     SpO2 12/09/20 1622 95 %     Weight 12/09/20 1617 150 lb (68 kg)     Height 12/09/20 1617 5\' 5"  (1.651 m)     Head Circumference --      Peak Flow --      Pain Score --      Pain Loc --      Pain Edu? --      Excl. in [place]? --     Constitutional: Alert. Well appearing and in no acute distress. Eyes: Conjunctivae are normal.  Head: Hematoma to right parietal scalp. Nose: No congestion/rhinnorhea. Mouth/Throat: Mucous membranes are moist.  Oropharynx non-erythematous. Neck: No stridor.   Cardiovascular: Normal rate, regular rhythm. Grossly normal heart sounds.  Good peripheral circulation. Respiratory: Normal respiratory effort.  No retractions. Lungs CTAB. Gastrointestinal: Soft and nontender. No distention. No abdominal bruits.  Genitourinary:  Musculoskeletal: No lower extremity tenderness nor edema.  No joint effusions.  Demonstrates active range of motion of all extremities. Neurologic:  Normal speech and language. No gross focal neurologic deficits are appreciated.  Skin: Abrasion to the right parietal scalp.  No active bleeding. Psychiatric: Mood and affect are normal. Speech and behavior are normal.  ____________________________________________   LABS (all labs ordered are listed, but only abnormal results are displayed)  Labs Reviewed  COMPREHENSIVE METABOLIC PANEL - Abnormal; Notable for the following components:      Result Value   Glucose, Bld 111 (*)    Total Bilirubin 0.2 (*)    All other components within normal limits  URINALYSIS, COMPLETE (UACMP) WITH MICROSCOPIC - Abnormal; Notable for the following components:   Color, Urine YELLOW (*)    APPearance CLEAR (*)    Leukocytes,Ua TRACE (*)    All other components within normal limits  CBC   ____________________________________________  EKG  ED ECG REPORT I, Sherrie George, the nurse  practitioner, personally viewed and interpreted this ECG.   Date: 12/09/2020  EKG Time: 4:19 PM  Rate: At 94  Rhythm: unchanged from previous tracings, normal sinus rhythm  Axis: Normal  Intervals:PAC  ST&T Change: No ST changes  ____________________________________________  RADIOLOGY  ED MD interpretation:    CT head and cervical spine negative for acute findings per radiology.  I, Sherrie George, personally viewed and evaluated these images (plain radiographs) as part of my medical decision making, as well as reviewing the written report by the radiologist.  Official radiology report(s): CT Head Wo Contrast  Result Date: 12/09/2020 CLINICAL DATA:  Fall with head and neck pain. EXAM: CT HEAD WITHOUT CONTRAST CT CERVICAL SPINE WITHOUT CONTRAST TECHNIQUE: Multidetector CT imaging of the head and cervical spine was performed following  the standard protocol without intravenous contrast. Multiplanar CT image reconstructions of the cervical spine were also generated. COMPARISON:  CT head dated 02/06/2018. FINDINGS: CT HEAD FINDINGS Brain: No evidence of acute infarction, hemorrhage, hydrocephalus, extra-axial collection or mass lesion/mass effect. There is moderate cerebral volume loss with associated ex vacuo dilatation. Periventricular white matter hypoattenuation likely represents chronic small vessel ischemic disease. Vascular: There are vascular calcifications in the carotid siphons. Skull: Normal. Negative for fracture or focal lesion. Sinuses/Orbits: No acute finding. Other: None. CT CERVICAL SPINE FINDINGS Alignment: Normal. Skull base and vertebrae: No acute fracture. No primary bone lesion or focal pathologic process. Soft tissues and spinal canal: No prevertebral fluid or swelling. No visible canal hematoma. Disc levels: Moderate to severe multilevel degenerative disc and joint disease. Upper chest: Biapical pleural parenchymal scarring is noted. Other: None IMPRESSION: 1. No acute  intracranial process. 2. No acute osseous injury in the cervical spine. Electronically Signed   By: Zerita Boers M.D.   On: 12/09/2020 17:02   CT Cervical Spine Wo Contrast  Result Date: 12/09/2020 CLINICAL DATA:  Fall with head and neck pain. EXAM: CT HEAD WITHOUT CONTRAST CT CERVICAL SPINE WITHOUT CONTRAST TECHNIQUE: Multidetector CT imaging of the head and cervical spine was performed following the standard protocol without intravenous contrast. Multiplanar CT image reconstructions of the cervical spine were also generated. COMPARISON:  CT head dated 02/06/2018. FINDINGS: CT HEAD FINDINGS Brain: No evidence of acute infarction, hemorrhage, hydrocephalus, extra-axial collection or mass lesion/mass effect. There is moderate cerebral volume loss with associated ex vacuo dilatation. Periventricular white matter hypoattenuation likely represents chronic small vessel ischemic disease. Vascular: There are vascular calcifications in the carotid siphons. Skull: Normal. Negative for fracture or focal lesion. Sinuses/Orbits: No acute finding. Other: None. CT CERVICAL SPINE FINDINGS Alignment: Normal. Skull base and vertebrae: No acute fracture. No primary bone lesion or focal pathologic process. Soft tissues and spinal canal: No prevertebral fluid or swelling. No visible canal hematoma. Disc levels: Moderate to severe multilevel degenerative disc and joint disease. Upper chest: Biapical pleural parenchymal scarring is noted. Other: None IMPRESSION: 1. No acute intracranial process. 2. No acute osseous injury in the cervical spine. Electronically Signed   By: Zerita Boers M.D.   On: 12/09/2020 17:02    ____________________________________________   PROCEDURES  Procedure(s) performed (including Critical Care):  Procedures  ____________________________________________   INITIAL IMPRESSION / ASSESSMENT AND PLAN     87 year old female presenting to the emergency department for treatment and evaluation  after nonsyncopal fall.  See HPI for further details.  Protocol labs are reassuring.  CT of the head and cervical spine is negative for any acute concerns.  Plan will be to get a urinalysis.   DIFFERENTIAL DIAGNOSIS  UTI, mechanical fall, head injury  ED COURSE  While here in the emergency department, the patient has been able to get up with some assistance and ambulate to the restroom.  Her work-up is reassuring.  No urinary tract infection.  No intervention required for the scalp abrasion.  Patient states that she would like to go home.  Will send her back to her care facility with head injury instructions.     ___________________________________________   FINAL CLINICAL IMPRESSION(S) / ED DIAGNOSES  Final diagnoses:  Fall, initial encounter  Minor head injury, initial encounter  Abrasion of scalp, initial encounter     ED Discharge Orders    None       ZARA WENDT was evaluated in Emergency Department on 12/09/2020 for  the symptoms described in the history of present illness. She was evaluated in the context of the global COVID-19 pandemic, which necessitated consideration that the patient might be at risk for infection with the SARS-CoV-2 virus that causes COVID-19. Institutional protocols and algorithms that pertain to the evaluation of patients at risk for COVID-19 are in a state of rapid change based on information released by regulatory bodies including the CDC and federal and state organizations. These policies and algorithms were followed during the patient's care in the ED.   Note:  This document was prepared using Dragon voice recognition software and may include unintentional dictation errors.   Victorino Dike, FNP 12/09/20 2107    Nance Pear, MD 12/09/20 2134

## 2020-12-11 DIAGNOSIS — S0001XA Abrasion of scalp, initial encounter: Secondary | ICD-10-CM

## 2020-12-11 DIAGNOSIS — Y92129 Unspecified place in nursing home as the place of occurrence of the external cause: Secondary | ICD-10-CM

## 2021-02-05 DIAGNOSIS — R319 Hematuria, unspecified: Secondary | ICD-10-CM

## 2021-02-15 DIAGNOSIS — F0150 Vascular dementia without behavioral disturbance: Secondary | ICD-10-CM

## 2021-02-15 DIAGNOSIS — I6990 Unspecified sequelae of unspecified cerebrovascular disease: Secondary | ICD-10-CM

## 2021-02-15 DIAGNOSIS — F39 Unspecified mood [affective] disorder: Secondary | ICD-10-CM

## 2021-02-15 DIAGNOSIS — M159 Polyosteoarthritis, unspecified: Secondary | ICD-10-CM

## 2021-03-12 ENCOUNTER — Emergency Department: Payer: Medicare Other

## 2021-03-12 ENCOUNTER — Encounter: Payer: Self-pay | Admitting: Emergency Medicine

## 2021-03-12 ENCOUNTER — Observation Stay
Admission: EM | Admit: 2021-03-12 | Discharge: 2021-03-15 | Disposition: A | Discharge status: Home / Self Care | Payer: Medicare Other | Attending: Internal Medicine | Admitting: Internal Medicine

## 2021-03-12 ENCOUNTER — Other Ambulatory Visit: Payer: Self-pay

## 2021-03-12 ENCOUNTER — Observation Stay: Payer: Medicare Other

## 2021-03-12 DIAGNOSIS — F0280 Dementia in other diseases classified elsewhere without behavioral disturbance: Secondary | ICD-10-CM

## 2021-03-12 DIAGNOSIS — Z853 Personal history of malignant neoplasm of breast: Secondary | ICD-10-CM | POA: Diagnosis not present

## 2021-03-12 DIAGNOSIS — Z20822 Contact with and (suspected) exposure to covid-19: Secondary | ICD-10-CM | POA: Diagnosis not present

## 2021-03-12 DIAGNOSIS — R4182 Altered mental status, unspecified: Secondary | ICD-10-CM | POA: Diagnosis not present

## 2021-03-12 DIAGNOSIS — W19XXXA Unspecified fall, initial encounter: Secondary | ICD-10-CM

## 2021-03-12 DIAGNOSIS — N390 Urinary tract infection, site not specified: Secondary | ICD-10-CM | POA: Diagnosis not present

## 2021-03-12 DIAGNOSIS — G309 Alzheimer's disease, unspecified: Secondary | ICD-10-CM | POA: Diagnosis not present

## 2021-03-12 DIAGNOSIS — R55 Syncope and collapse: Secondary | ICD-10-CM | POA: Diagnosis present

## 2021-03-12 DIAGNOSIS — R410 Disorientation, unspecified: Secondary | ICD-10-CM

## 2021-03-12 DIAGNOSIS — B965 Pseudomonas (aeruginosa) (mallei) (pseudomallei) as the cause of diseases classified elsewhere: Secondary | ICD-10-CM | POA: Diagnosis not present

## 2021-03-12 DIAGNOSIS — I10 Essential (primary) hypertension: Secondary | ICD-10-CM | POA: Diagnosis not present

## 2021-03-12 DIAGNOSIS — F0390 Unspecified dementia without behavioral disturbance: Secondary | ICD-10-CM | POA: Diagnosis not present

## 2021-03-12 DIAGNOSIS — Z79899 Other long term (current) drug therapy: Secondary | ICD-10-CM | POA: Diagnosis not present

## 2021-03-12 DIAGNOSIS — R2689 Other abnormalities of gait and mobility: Secondary | ICD-10-CM | POA: Diagnosis not present

## 2021-03-12 DIAGNOSIS — R4020 Unspecified coma: Secondary | ICD-10-CM

## 2021-03-12 LAB — URINALYSIS, COMPLETE (UACMP) WITH MICROSCOPIC
Bilirubin Urine: NEGATIVE
Glucose, UA: NEGATIVE mg/dL
Nitrite: NEGATIVE
Specific Gravity, Urine: 1.025 (ref 1.005–1.030)
WBC, UA: >50 WBC/hpf (ref 0–5)
pH: 6.5 (ref 5.0–8.0)

## 2021-03-12 LAB — CBC WITH DIFFERENTIAL/PLATELET
Abs Immature Granulocytes: 0.06 10*3/uL (ref 0.00–0.07)
Basophils Absolute: 0.1 10*3/uL (ref 0.0–0.1)
Basophils Relative: 0 %
Eosinophils Absolute: 0.1 10*3/uL (ref 0.0–0.5)
Eosinophils Relative: 1 %
HCT: 39.3 % (ref 36.0–46.0)
Hemoglobin: 13.3 g/dL (ref 12.0–15.0)
Immature Granulocytes: 0 %
Lymphocytes Relative: 26 %
Lymphs Abs: 3.5 10*3/uL (ref 0.7–4.0)
MCH: 29.7 pg (ref 26.0–34.0)
MCHC: 33.8 g/dL (ref 30.0–36.0)
MCV: 87.7 fL (ref 80.0–100.0)
Monocytes Absolute: 1.1 10*3/uL — ABNORMAL HIGH (ref 0.1–1.0)
Monocytes Relative: 8 %
Neutro Abs: 8.5 10*3/uL — ABNORMAL HIGH (ref 1.7–7.7)
Neutrophils Relative %: 65 %
Platelets: 286 10*3/uL (ref 150–400)
RBC: 4.48 MIL/uL (ref 3.87–5.11)
RDW: 13.8 % (ref 11.5–15.5)
WBC: 13.4 10*3/uL — ABNORMAL HIGH (ref 4.0–10.5)
nRBC: 0 % (ref 0.0–0.2)

## 2021-03-12 LAB — COMPREHENSIVE METABOLIC PANEL
ALT: 13 U/L (ref 0–44)
AST: 23 U/L (ref 15–41)
Albumin: 3.5 g/dL (ref 3.5–5.0)
Alkaline Phosphatase: 87 U/L (ref 38–126)
Anion gap: 8 (ref 5–15)
BUN: 13 mg/dL (ref 8–23)
CO2: 24 mmol/L (ref 22–32)
Calcium: 8.4 mg/dL — ABNORMAL LOW (ref 8.9–10.3)
Chloride: 102 mmol/L (ref 98–111)
Creatinine, Ser: 0.8 mg/dL (ref 0.44–1.00)
GFR, Estimated: >60 mL/min (ref >60)
Glucose, Bld: 171 mg/dL — ABNORMAL HIGH (ref 70–99)
Potassium: 3.7 mmol/L (ref 3.5–5.1)
Sodium: 134 mmol/L — ABNORMAL LOW (ref 135–145)
Total Bilirubin: 0.7 mg/dL (ref 0.3–1.2)
Total Protein: 6.2 g/dL — ABNORMAL LOW (ref 6.5–8.1)

## 2021-03-12 LAB — TROPONIN I (HIGH SENSITIVITY)
Troponin I (High Sensitivity): 10 ng/L (ref <18)
Troponin I (High Sensitivity): 10 ng/L (ref <18)

## 2021-03-12 LAB — AMMONIA: Ammonia: 13 umol/L (ref 9–35)

## 2021-03-12 MED ORDER — ENOXAPARIN SODIUM 40 MG/0.4ML IJ SOSY
40.0000 mg | PREFILLED_SYRINGE | INTRAMUSCULAR | Status: DC
  Administered 2021-03-12 – 2021-03-14 (×3): 40 mg via SUBCUTANEOUS
  Filled 2021-03-12 (×3): qty 0.4

## 2021-03-12 MED ORDER — ASPIRIN 81 MG PO CHEW
81.0000 mg | CHEWABLE_TABLET | Freq: Every day | ORAL | Status: DC
  Administered 2021-03-14 – 2021-03-15 (×2): 81 mg via ORAL
  Filled 2021-03-12 (×2): qty 1

## 2021-03-12 MED ORDER — CIPROFLOXACIN IN D5W 200 MG/100ML IV SOLN
200.0000 mg | Freq: Two times a day (BID) | INTRAVENOUS | Status: DC
  Administered 2021-03-12 – 2021-03-13 (×2): 200 mg via INTRAVENOUS
  Filled 2021-03-12 (×3): qty 100

## 2021-03-12 MED ORDER — SODIUM CHLORIDE 0.9% FLUSH
3.0000 mL | Freq: Two times a day (BID) | INTRAVENOUS | Status: DC
  Administered 2021-03-12 – 2021-03-15 (×6): 3 mL via INTRAVENOUS

## 2021-03-12 MED ORDER — INSULIN ASPART 100 UNIT/ML IJ SOLN: 0.0000 [IU] | Freq: Three times a day (TID) | INTRAMUSCULAR | Status: DC

## 2021-03-12 MED ORDER — ACETAMINOPHEN 500 MG PO TABS: 500.0000 mg | ORAL_TABLET | ORAL | Status: DC | PRN

## 2021-03-12 NOTE — H&P (Signed)
History and Physical    Robin Lynn [id number] DOB: 02-Aug-1918 DOA: 03/12/2021  PCP: Venia Carbon, MD  Patient coming from: SNF  I have personally briefly reviewed patient's old medical records in [place]  Chief Complaint: altered mental status  HPI: Robin Lynn is a 87 y.o. female with medical history significant for dementia, Hx of breast cancer, hypertension hyperlipidemia who presents from SNF with concerns of altered mental status.  On my evaluation, patient was lethargic and awoke to her name being called out but otherwise unable to provide history.  No family at bedside.  Per ED physician, she is only alert and oriented to self.  Reportedly patient had a fall an unknown amount of time ago and has been taking Tylenol for pain.  However, she was given tramadol today and several hours after medication administration was found to be slumped over in her chair with labored respirations and had multiple episodes of vomiting.  In the ED, she was afebrile and normotensive on room air.  WBC of 13.  Normal hemoglobin of 13.3.  BG of 171 with otherwise unremarkable electrolytes.  Troponin of 10.  CT head and cervical spine was negative.  Chest x-ray was negative.  UA is pending In-N-Out cath by RN.  COVID PCR pending.   Review of Systems: Unable to obtain due to pt's dementia and lethargy  Past Medical History:  Diagnosis Date   Cancer ([place])    History of breast cancer    left   HLD (hyperlipidemia)    HTN (hypertension)    Urinary incontinence    Vascular dementia without behavioral disturbance ([place])     Past Surgical History:  Procedure Laterality Date   APPENDECTOMY     BREAST LUMPECTOMY Left    with radiation     reports that she has never smoked. She has never used smokeless tobacco. She reports current alcohol use of about 1.0 standard drink per week. She reports that she does not use drugs. Social History  Allergies  Allergen Reactions    Penicillins     Family History  Problem Relation Age of Onset   CAD Father    Heart attack Father    Cancer Other    CAD Sister    Diabetes Neg Hx      Prior to Admission medications   Medication Sig Start Date End Date Taking? Authorizing Provider  acetaminophen (TYLENOL) 325 MG tablet Take 325-650 mg by mouth every 4 (four) hours as needed.    [provider]  alum & mag hydroxide-simeth (MAALOX/MYLANTA) 200-200-20 MG/5ML suspension Take 30 mLs by mouth every 4 (four) hours as needed for indigestion or heartburn.    [provider]  bismuth subsalicylate (KAOPECTATE) 262 MG/15ML suspension Take 10 mLs by mouth 3 (three) times daily as needed.    [provider]  co-enzyme Q-10 30 MG capsule Take 1 capsule by mouth daily.    [provider]  escitalopram (LEXAPRO) 10 MG tablet Take 10 mg by mouth daily.    [provider]  guaiFENesin-dextromethorphan (ROBITUSSIN DM) 100-10 MG/5ML syrup Take 10 mLs by mouth every 4 (four) hours as needed for cough.    [provider]  losartan (COZAAR) 100 MG tablet Take 1 tablet (100 mg total) by mouth daily. 02/09/18   Gladstone Lighter, MD  magnesium hydroxide (MILK OF MAGNESIA) 400 MG/5ML suspension Take 30 mLs by mouth daily as needed for mild constipation.    [provider]  nystatin (MYCOSTATIN/NYSTOP) powder Apply 1 g topically 2 (two) times daily. To folds and under the breasts    [provider]  therapeutic multivitamin-minerals (THERAGRAN-M) tablet Take 1 tablet by mouth daily.    [provider]  Vitamin D, Cholecalciferol, 1000 units CAPS Take by mouth.    [provider]  vitamin E 400 UNIT capsule Take 400 Units by mouth daily.    [provider]    Physical Exam: Vitals:   03/12/21 1421 03/12/21 1500 03/12/21 1530 03/12/21 1537  BP:  (!) 137/54 (!) 98/45 131/69  Pulse:  84 83 82  Resp:  '14 14 14  '$ Temp: 97.6 F (36.4 C)     TempSrc:  Oral     SpO2:  96% 96% 96%  Weight: 65 kg       Constitutional: Lethargic chronically ill appearing elderly female sitting upright in bed asleep.  Patient awoke easily to voice and light touch but has hearing impairment and could only say "huh" when asked questions and would close eyes and moan Vitals:   03/12/21 1421 03/12/21 1500 03/12/21 1530 03/12/21 1537  BP:  (!) 137/54 (!) 98/45 131/69  Pulse:  84 83 82  Resp:  '14 14 14  '$ Temp: 97.6 F (36.4 C)     TempSrc: Oral     SpO2:  96% 96% 96%  Weight: 65 kg      Eyes: No pinpoint pupils, PERRL, lids and conjunctivae normal ENMT: Mucous membranes are moist.  Neck: normal, supple Respiratory: clear to auscultation bilaterally, no wheezing, no crackles. Normal respiratory effort. No accessory muscle use.  Cardiovascular: Regular rate and rhythm, no murmurs / rubs / gallops.  Trace nonpitting edema of the distal left lower extremity.. 2+ pedal pulses.  Abdomen: no tenderness, no masses palpated. Bowel sounds positive.  Musculoskeletal: no clubbing / cyanosis. No joint deformity upper and lower extremities.  Skin: no rashes, lesions, ulcers. No induration Neurologic: Alert but unable to answer any questions due to hearing impairment.  Moaning throughout evaluation mostly with eyes closed.  Able to withdraw extremities with palpation. Psychiatric: Lethargic.  Unable to assess due to dementia and altered mental status.    Labs on Admission: I have personally reviewed following labs and imaging studies  CBC: Recent Labs  Lab 03/12/21 1413  WBC 13.4*  NEUTROABS 8.5*  HGB 13.3  HCT 39.3  MCV 87.7  PLT [id number]   Basic Metabolic Panel: Recent Labs  Lab 03/12/21 1413  NA 134*  K 3.7  CL 102  CO2 24  GLUCOSE 171*  BUN 13  CREATININE 0.80  CALCIUM 8.4*   GFR: Estimated Creatinine Clearance: 33.6 mL/min (by C-G formula based on SCr of 0.8 mg/dL). Liver Function Tests: Recent Labs  Lab 03/12/21 1413  AST 23  ALT 13  ALKPHOS  87  BILITOT 0.7  PROT 6.2*  ALBUMIN 3.5   No results for input(s): LIPASE, AMYLASE in the last 168 hours. No results for input(s): AMMONIA in the last 168 hours. Coagulation Profile: No results for input(s): INR, PROTIME in the last 168 hours. Cardiac Enzymes: No results for input(s): CKTOTAL, CKMB, CKMBINDEX, TROPONINI in the last 168 hours. BNP (last 3 results) No results for input(s): PROBNP in the last 8760 hours. HbA1C: No results for input(s): HGBA1C in the last 72 hours. CBG: No results for input(s): GLUCAP in the last 168 hours. Lipid Profile: No results for input(s): CHOL, HDL, LDLCALC, TRIG, CHOLHDL, LDLDIRECT in the last 72 hours. Thyroid Function Tests:  No results for input(s): TSH, T4TOTAL, FREET4, T3FREE, THYROIDAB in the last 72 hours. Anemia Panel: No results for input(s): VITAMINB12, FOLATE, FERRITIN, TIBC, IRON, RETICCTPCT in the last 72 hours. Urine analysis:    Component Value Date/Time   COLORURINE YELLOW (A) 12/09/2020 2004   APPEARANCEUR CLEAR (A) 12/09/2020 2004   LABSPEC 1.010 12/09/2020 2004   PHURINE 7.0 12/09/2020 2004   GLUCOSEU NEGATIVE 12/09/2020 2004   HGBUR NEGATIVE 12/09/2020 2004   BILIRUBINUR NEGATIVE 12/09/2020 2004   [place] NEGATIVE 12/09/2020 2004   PROTEINUR NEGATIVE 12/09/2020 2004   NITRITE NEGATIVE 12/09/2020 2004   LEUKOCYTESUR TRACE (A) 12/09/2020 2004    Radiological Exams on Admission: CT HEAD WO CONTRAST (5MM)  Result Date: 03/12/2021 CLINICAL DATA:  Patient status post fall. EXAM: CT HEAD WITHOUT CONTRAST CT CERVICAL SPINE WITHOUT CONTRAST TECHNIQUE: Multidetector CT imaging of the head and cervical spine was performed following the standard protocol without intravenous contrast. Multiplanar CT image reconstructions of the cervical spine were also generated. COMPARISON:  Brain and C-spine CT 07/17/2016. FINDINGS: CT HEAD FINDINGS Brain: Ventricles and sulci are appropriate for patient's age. No evidence for acute cortically  based infarct, intracranial hemorrhage, mass lesion or mass-effect. Vascular: Unremarkable Skull: Intact. Sinuses/Orbits: Polypoid mucosal thickening left maxillary sinus. Remainder the paranasal sinuses are well aerated. Mastoid air cells are unremarkable. Other: None CT CERVICAL SPINE FINDINGS Alignment: Motion artifact limits evaluation. Normal anatomic alignment. Skull base and vertebrae: Intact. Soft tissues and spinal canal: No prevertebral fluid or swelling. No visible canal hematoma. Disc levels: No acute fracture. C4-5 and C5-6 degenerative disc disease. Multilevel facet degenerative changes. Upper chest: Subpleural scarring within the left lung apex. Other: None. IMPRESSION: No acute intracranial process. No acute cervical spine fracture Electronically Signed   By: Lovey Newcomer M.D.   On: 03/12/2021 15:08   CT Cervical Spine Wo Contrast  Result Date: 03/12/2021 CLINICAL DATA:  Patient status post fall. EXAM: CT HEAD WITHOUT CONTRAST CT CERVICAL SPINE WITHOUT CONTRAST TECHNIQUE: Multidetector CT imaging of the head and cervical spine was performed following the standard protocol without intravenous contrast. Multiplanar CT image reconstructions of the cervical spine were also generated. COMPARISON:  Brain and C-spine CT 07/17/2016. FINDINGS: CT HEAD FINDINGS Brain: Ventricles and sulci are appropriate for patient's age. No evidence for acute cortically based infarct, intracranial hemorrhage, mass lesion or mass-effect. Vascular: Unremarkable Skull: Intact. Sinuses/Orbits: Polypoid mucosal thickening left maxillary sinus. Remainder the paranasal sinuses are well aerated. Mastoid air cells are unremarkable. Other: None CT CERVICAL SPINE FINDINGS Alignment: Motion artifact limits evaluation. Normal anatomic alignment. Skull base and vertebrae: Intact. Soft tissues and spinal canal: No prevertebral fluid or swelling. No visible canal hematoma. Disc levels: No acute fracture. C4-5 and C5-6 degenerative disc  disease. Multilevel facet degenerative changes. Upper chest: Subpleural scarring within the left lung apex. Other: None. IMPRESSION: No acute intracranial process. No acute cervical spine fracture Electronically Signed   By: Lovey Newcomer M.D.   On: 03/12/2021 15:08   DG Chest Portable 1 View  Result Date: 03/12/2021 CLINICAL DATA:  Pneumonia.  Cough and shortness breath. EXAM: PORTABLE CHEST 1 VIEW COMPARISON:  Two-view chest x-ray 02/03/2018 FINDINGS: Heart is enlarged. Atherosclerotic calcifications are present at the aortic arch. Lung volumes are low. Chronic interstitial coarsening is present without edema or effusion. No focal airspace disease is present. IMPRESSION: 1. Cardiomegaly without failure. 2. Low lung volumes. Electronically Signed   By: San Morelle M.D.   On: 03/12/2021 15:15   DG HIP UNILAT WITH  PELVIS 2-3 VIEWS LEFT  Result Date: 03/12/2021 CLINICAL DATA:  On yesterday.  Left hip pain. EXAM: DG HIP (WITH OR WITHOUT PELVIS) 2-3V LEFT COMPARISON:  Left femur radiographs 01/29/2011. FINDINGS: Left hip is located. No acute fracture is present. Mild osteopenia noted. Degenerative changes are present in the lower lumbar spine. Atherosclerotic calcifications are present. IMPRESSION: 1. No acute abnormality. 2. Atherosclerosis. Electronically Signed   By: San Morelle M.D.   On: 03/12/2021 16:29   DG HIP UNILAT WITH PELVIS 2-3 VIEWS RIGHT  Result Date: 03/12/2021 CLINICAL DATA:  Syncopal episode.  Fall with bilateral hip pain. EXAM: DG HIP (WITH OR WITHOUT PELVIS) 2-3V RIGHT COMPARISON:  Radiographs 05/12/2011. FINDINGS: The bones are demineralized. There is no evidence of acute fracture, dislocation or femoral head avascular necrosis. Mild degenerative changes are present at both hips and sacroiliac joints. Vascular calcifications are noted. Possible ossification along the right iliopsoas tendon near its insertion. IMPRESSION: No evidence of acute fracture or dislocation.  Electronically Signed   By: Richardean Sale M.D.   On: 03/12/2021 16:30      Assessment/Plan  Altered mental status - Unclear etiology suspect could be due to medication vs UTI.  Patient reportedly was given tramadol today for pain following a remote fall. -UA in and out cath with moderate leukocyte, negative nitrite and greater than 50 WBC.  WBC is elevated 13.  Will give fosfomycin due to unknown reaction to penicillin  -Add ammonia level -pelvic X-ray obtained which showed no acute fractures -if no clinical improvement by tomorrow, consider adding EEG.  CT head and cervical spine negative.  Hypertension -controlled  Mild hyperglycemia - BG of 171 on admit.  Placed on very sensitive sliding scale and obtain hemoglobin A1c.  Dementia - Patient lethargic and has hearing impairment.  Per EDP, she is alert and oriented only to self  DVT prophylaxis:.Lovenox Code Status: DNR-confirmed with family Family Communication: No family at bedside but spoke with grand-daughter, Merri Ray disposition Plan: Home with observation Consults called:  Admission status: Observation  Level of care: Med-Surg  Status is: Observation  The patient remains OBS appropriate and will d/c before 2 midnights.  Dispo: The patient is from: SNF              Anticipated d/c is to: SNF              Patient currently is not medically stable to d/c.   Difficult to place patient No         Orene Desanctis DO [facility]   If 7PM-7AM, please contact night-coverage [email or web address]   03/12/2021, 5:00 PM

## 2021-03-12 NOTE — ED Triage Notes (Signed)
Pt ems from [facility] for syncopal episode. Pt with hx fall yesterday c/o  of pain and was given a tramadol. After unknown time pt had loss of consciousness. Pt arrives awake, gcs 14, denies pain, with N/V. Pt with hx dementia. Pt has out of facility  DNR.

## 2021-03-12 NOTE — Consult Note (Signed)
Pharmacy Antibiotic Note  Robin Lynn is a 87 y.o. female admitted on 03/12/2021 with UTI.  Pharmacy has been consulted for ciprofloxacin dosing.  Plan: Ciprofloxacin 200 mg IV Q12H  Weight: 65 kg (143 lb 3.2 oz)  Temp (24hrs), Avg:97.6 F (36.4 C), Min:97.6 F (36.4 C), Max:97.6 F (36.4 C)  Recent Labs  Lab 03/12/21 1413  WBC 13.4*  CREATININE 0.80    Estimated Creatinine Clearance: 33.6 mL/min (by C-G formula based on SCr of 0.8 mg/dL).    Allergies  Allergen Reactions   Penicillins     Antimicrobials this admission: 03/12/21 Cipro >>    Dose adjustments this admission:   Microbiology results: 9/5 UCx: sent     Thank you for allowing pharmacy to be a part of this patient's care.  Dorothe Pea, PharmD, BCPS Clinical Pharmacist   03/12/2021 6:19 PM

## 2021-03-12 NOTE — ED Provider Notes (Signed)
Patient  [facility]  ____________________________________________   Event Date/Time   First MD Initiated Contact with Patient 03/12/21 1352     (approximate)  I have reviewed the triage vital signs and the nursing notes.   HISTORY  Chief Complaint Loss of Consciousness and Nausea    HPI Robin Lynn is a 87 y.o. female past medical history of vascular dementia, hypertension, hyperlipidemia who presents after syncopal episode.  Per EMS's facility had been giving her Tylenol for hip pain after a fall several weeks ago.  They then gave her tramadol today and after standing up she had a syncopal episode.  I am unable to obtain additional history regarding onset, quality or associated symptoms secondary to the patient's dementia.  In speaking with the patient's nursing home.  Apparently they found her slumped over in her wheelchair approximately 2 hours after receiving tramadol.  She was pale and had a thready pulse.  After watching her for several minutes she improved but was then speaking incoherently and had multiple episodes of emesis.          Past Medical History:  Diagnosis Date   Cancer [facility])    History of breast cancer    left   HLD (hyperlipidemia)    HTN (hypertension)    Urinary incontinence    Vascular dementia without behavioral disturbance [facility])     Patient Active Problem List   Diagnosis Date Noted   Syncope and collapse 02/06/2018   History of breast cancer 12/05/2017   Advance directive discussed with patient 08/21/2016   Vascular dementia without behavioral disturbance ([place]) 02/14/2016   HTN (hypertension) 03/23/2015   HLD (hyperlipidemia) 03/23/2015    Past Surgical History:  Procedure Laterality Date   APPENDECTOMY     BREAST LUMPECTOMY Left    with radiation    Prior to Admission medications   Medication Sig Start Date End Date Taking? Authorizing Provider  acetaminophen (TYLENOL) 325 MG tablet Take 325-650 mg by  mouth every 4 (four) hours as needed.    [provider]  alum & mag hydroxide-simeth (MAALOX/MYLANTA) 200-200-20 MG/5ML suspension Take 30 mLs by mouth every 4 (four) hours as needed for indigestion or heartburn.    [provider]  bismuth subsalicylate (KAOPECTATE) 262 MG/15ML suspension Take 10 mLs by mouth 3 (three) times daily as needed.    [provider]  co-enzyme Q-10 30 MG capsule Take 1 capsule by mouth daily.    [provider]  escitalopram (LEXAPRO) 10 MG tablet Take 10 mg by mouth daily.    [provider]  guaiFENesin-dextromethorphan (ROBITUSSIN DM) 100-10 MG/5ML syrup Take 10 mLs by mouth every 4 (four) hours as needed for cough.    [provider]  losartan (COZAAR) 100 MG tablet Take 1 tablet (100 mg total) by mouth daily. 02/09/18   Gladstone Lighter, MD  magnesium hydroxide (MILK OF MAGNESIA) 400 MG/5ML suspension Take 30 mLs by mouth daily as needed for mild constipation.    [provider]  nystatin (MYCOSTATIN/NYSTOP) powder Apply 1 g topically 2 (two) times daily. To folds and under the breasts    [provider]  therapeutic multivitamin-minerals (THERAGRAN-M) tablet Take 1 tablet by mouth daily.    [provider]  Vitamin D, Cholecalciferol, 1000 units CAPS Take by mouth.    [provider]  vitamin E 400 UNIT capsule Take 400 Units by mouth daily.    [provider]    Allergies Penicillins  Family History  Problem Relation Age of Onset   CAD Father    Heart attack Father    Cancer Other    CAD Sister    Diabetes Neg Hx     Social History Social History   Tobacco Use   Smoking status: Never   Smokeless tobacco: Never  Vaping Use   Vaping Use: Never used  Substance Use Topics   Alcohol use: Yes    Alcohol/week: 1.0 standard drink    Types: 1 Glasses of wine per week   Drug use: No    Review of Systems   Review of Systems  Unable to perform ROS:  Dementia   Physical Exam Updated Vital Signs Temp 97.6 F (36.4 C) (Oral)   Wt 65 kg   BMI 23.83 kg/m   Physical Exam Vitals and nursing note reviewed.  Constitutional:      General: She is not in acute distress.    Appearance: Normal appearance.  HENT:     Head: Normocephalic and atraumatic.     Mouth/Throat:     Mouth: Mucous membranes are moist.     Pharynx: Oropharynx is clear.  Eyes:     General: No scleral icterus.    Conjunctiva/sclera: Conjunctivae normal.  Cardiovascular:     Rate and Rhythm: Normal rate.  Pulmonary:     Effort: Pulmonary effort is normal. No respiratory distress.     Breath sounds: No stridor.  Abdominal:     General: There is no distension.     Palpations: Abdomen is soft.     Tenderness: There is no abdominal tenderness. There is no guarding.  Musculoskeletal:        General: No deformity or signs of injury.     Cervical back: Normal range of motion and neck supple. No rigidity or tenderness.     Comments: Able to range both hips Normal range of motion of both knees, tenderness with range of the left knee without swelling or deformity  Skin:    General: Skin is dry.     Coloration: Skin is not jaundiced or pale.  Neurological:     Mental Status: She is alert.     Comments: Patient is very hard of hearing, does not follow commands, oriented to person only  Psychiatric:        Mood and Affect: Mood normal.        Behavior: Behavior normal.     LABS (all labs ordered are listed, but only abnormal results are displayed)  Labs Reviewed  COMPREHENSIVE METABOLIC PANEL - Abnormal; Notable for the following components:      Result Value   Sodium 134 (*)    Glucose, Bld 171 (*)    Calcium 8.4 (*)    Total Protein 6.2 (*)    All other components within normal limits  CBC WITH DIFFERENTIAL/PLATELET - Abnormal; Notable for the following components:   WBC 13.4 (*)    Neutro Abs 8.5 (*)    Monocytes Absolute 1.1 (*)    All other components  within normal limits  URINALYSIS, COMPLETE (UACMP) WITH MICROSCOPIC  TROPONIN I (HIGH SENSITIVITY)   ____________________________________________  EKG  Normal sinus rhythm, normal axis, normal intervals, ectopy with PACs, no acute ischemic changes ____________________________________________  RADIOLOGY Almeta Monas, personally viewed and evaluated these images (plain radiographs) as part of my medical decision making, as well as reviewing the written report by the radiologist.  ED MD interpretation: I reviewed the chest x-ray which does not show any  acute cardiopulmonary process  Head and C-spine which were also read by radiology as no acute abnormality    ____________________________________________   PROCEDURES  Procedure(s) performed (including Critical Care):  Procedures   ____________________________________________   INITIAL IMPRESSION / ASSESSMENT AND PLAN / ED COURSE     The patient is 87 year old female who is DNR who presents after a syncopal episode versus transient episode of altered mental status.  Patient was found slumped over in her wheelchair with a thready pulse and pale skin.  She eventually turned around but was altered and then had multiple episodes of emesis so she was brought to the ED.  Her vital signs are within normal limits.  She is demented, oriented to person only moves all extremities normally.  She has no abdominal tenderness.  Is covered in emesis.  No signs of trauma on exam.  Her EKG is nonischemic normal intervals.  Labs are notable for leukocytosis of 13.  Her chest x-ray does not show any acute infiltrate.  CT head and C-spine were obtained which are negative.  Differential diagnosis includes mainly syncope versus seizures.  Will admit for observation on telemetry.      ____________________________________________   FINAL CLINICAL IMPRESSION(S) / ED DIAGNOSES  Final diagnoses:  Loss of consciousness [facility])     ED  Discharge Orders     None        Note:  This document was prepared using Dragon voice recognition software and may include unintentional dictation errors.    Rada Hay, MD 03/12/21 1515

## 2021-03-13 DIAGNOSIS — R410 Disorientation, unspecified: Secondary | ICD-10-CM | POA: Diagnosis not present

## 2021-03-13 LAB — GLUCOSE, CAPILLARY
Glucose-Capillary: 115 mg/dL — ABNORMAL HIGH (ref 70–99)
Glucose-Capillary: 111 mg/dL — ABNORMAL HIGH (ref 70–99)
Glucose-Capillary: 95 mg/dL (ref 70–99)
Glucose-Capillary: 90 mg/dL (ref 70–99)

## 2021-03-13 LAB — HEMOGLOBIN A1C
Hgb A1c MFr Bld: 5.8 % — ABNORMAL HIGH (ref 4.8–5.6)
Mean Plasma Glucose: 119.76 mg/dL

## 2021-03-13 LAB — SARS CORONAVIRUS 2 (TAT 6-24 HRS): SARS Coronavirus 2: NEGATIVE

## 2021-03-13 MED ORDER — SODIUM CHLORIDE 0.9 % IV SOLN
1.0000 g | INTRAVENOUS | Status: DC
  Administered 2021-03-13: 15:00:00 1 g via INTRAVENOUS
  Filled 2021-03-13: qty 10
  Filled 2021-03-13: qty 1

## 2021-03-13 NOTE — TOC Progression Note (Signed)
Transition of Care [facility]) - Progression Note    Patient Details  Name: Robin Lynn MRN: [id number] Date of Birth: 1919/01/09  [facility] of [facility] CM/SW [place], RN Phone Number: 03/13/2021, 3:43 PM  Clinical Narrative:   Patient is from [facility].  Seth Bake at facility notified that patient may be discharged tomorrow.  Spoke to daughter, Malachy Mood [id number] states that she would like patient to return to [facility].           Expected Discharge Plan and Services                                                 Social Determinants of Health (SDOH) Interventions    Readmission Risk Interventions No flowsheet data found.

## 2021-03-13 NOTE — Progress Notes (Signed)
Ok to [employer] tele monitoring per Dr Tawanna Solo

## 2021-03-13 NOTE — Progress Notes (Signed)
PT Cancellation Note  Patient Details Name: Robin Lynn MRN: [id number] DOB: 1918-10-21   Cancelled Treatment:    Reason Eval/Treat Not Completed: Patient declined, no reason specified. Orders received and chart reviewed. Upon entry to room, pt pleasantly denying PT despite encouragement due to fatigue and "not feeling well". Pt requesting for PT to re-attempt tomorrow. PT will re-attempt as available at a later date.   Salem Caster. Fairly IV, PT, DPT Physical Therapist- [facility]  03/13/2021, 3:00 PM

## 2021-03-13 NOTE — Progress Notes (Signed)
PROGRESS NOTE    Robin Lynn  [id number] DOB: 04-Apr-1919 DOA: 03/12/2021 PCP: Venia Carbon, MD   Chief Complain: Confusion  Brief Narrative: Patient is 87 year old female with history of dementia, history of breast cancer, hypertension, hyperlipidemia who was brought from skilled nursing facility with concerns of change in mental status.  On baseline, patient is confused from dementia.  She is alert and oriented to self only.  There was also report of fall at her skilled nursing facility.  Patient was given tramadol at nursing facility, she was then found to be slumped over in her chair with labored respiration and also vomited multiple times.  On presentation she was hemodynamically stable.  She had mild leukocytosis.  CT head/cervical spine was negative.  Chest x-ray was negative for acute findings.  UA was suggestive of UTI, given a dose of fosfomycin.Now on ceftriaxone. Urine  culture pending.  Assessment & Plan:   Active Problems:   AMS (altered mental status)   Altered mental status/confusion: On baseline, patient is confused from dementia.  She is alert and oriented to self only.  There was also report of fall at her skilled nursing facility.  Patient was given tramadol at nursing facility, she was then found to be slumped over in her chair with labored respiration and also vomited multiple times.  CT head/cervical spine was negative.  Chest x-ray was negative for acute findings.  Ammonia level normal Her mental status looks improved already.  Suspected UTI: No fever.  Had mild leukocytosis on presentation.On ceftriaxone  Report of fall: Skilled nursing facility resident.  CTA/ROS are negative.  Pelvic x-ray did not show any acute fractures.  Hypertension: Currently blood pressure well controlled.  On losartan at baseline  Hyperglycemia: Continue sliding scale insulin.A1c of 5.8  Debility/deconditioning: SN resident.  Has dementia which looks like advanced.  Plan is to  send her back to skilled nursing facility.         DVT prophylaxis:Lovenox Code Status: DNR Family Communication: Discussed with daughter on phone on 9.6.22 Status is: Observation  The patient remains OBS appropriate and will d/c before 2 midnights.  Dispo: The patient is from: SNF              Anticipated d/c is to: SNF              Patient currently is not medically stable to d/c.   Difficult to place patient No      Consultants: None  Procedures:None  Antimicrobials:  Anti-infectives (From admission, onward)    Start     Dose/Rate Route Frequency Ordered Stop   03/12/21 2000  ciprofloxacin (CIPRO) IVPB 200 mg        200 mg 100 mL/hr over 60 Minutes Intravenous Every 12 hours 03/12/21 1816         Subjective:  Patient seen and examined at the bedside this morning she was calm and cooperative.  She was confused.  Her mental status might have improved.  She was not agitated.  Looks comfortable   Objective: Vitals:   03/13/21 0400 03/13/21 0500 03/13/21 0858 03/13/21 1154  BP: (!) 123/58  (!) 156/84 (!) 142/63  Pulse: 86  (!) 101 92  Resp: '18  20 20  '$ Temp: 98.1 F (36.7 C)  98.3 F (36.8 C) 98.3 F (36.8 C)  TempSrc: Oral  Oral   SpO2: 98%  95% 95%  Weight:  60.3 kg      Intake/Output Summary (Last 24 hours) at 03/13/2021  [place] filed at 03/13/2021 0600 Gross per 24 hour  Intake 200 ml  Output --  Net 200 ml   Filed Weights   03/12/21 1421 03/13/21 0500  Weight: 65 kg 60.3 kg    Examination:  General exam: Overall comfortable, not in distress, elderly female, pleasantly confused HEENT: PERRL Respiratory system:  no wheezes or crackles  Cardiovascular system: S1 & S2 heard, RRR.  Gastrointestinal system: Abdomen is nondistended, soft and nontender. Central nervous system: Alert and awake but not oriented Extremities: No edema, no clubbing ,no cyanosis Skin: No rashes, no ulcers,no icterus      Data Reviewed: I have personally  reviewed following labs and imaging studies  CBC: Recent Labs  Lab 03/12/21 1413  WBC 13.4*  NEUTROABS 8.5*  HGB 13.3  HCT 39.3  MCV 87.7  PLT [id number]   Basic Metabolic Panel: Recent Labs  Lab 03/12/21 1413  NA 134*  K 3.7  CL 102  CO2 24  GLUCOSE 171*  BUN 13  CREATININE 0.80  CALCIUM 8.4*   GFR: Estimated Creatinine Clearance: 33.6 mL/min (by C-G formula based on SCr of 0.8 mg/dL). Liver Function Tests: Recent Labs  Lab 03/12/21 1413  AST 23  ALT 13  ALKPHOS 87  BILITOT 0.7  PROT 6.2*  ALBUMIN 3.5   No results for input(s): LIPASE, AMYLASE in the last 168 hours. Recent Labs  Lab 03/12/21 1634  AMMONIA 13   Coagulation Profile: No results for input(s): INR, PROTIME in the last 168 hours. Cardiac Enzymes: No results for input(s): CKTOTAL, CKMB, CKMBINDEX, TROPONINI in the last 168 hours. BNP (last 3 results) No results for input(s): PROBNP in the last 8760 hours. HbA1C: Recent Labs    03/12/21 1413  HGBA1C 5.8*   CBG: Recent Labs  Lab 03/13/21 0857 03/13/21 1155  GLUCAP 115* 111*   Lipid Profile: No results for input(s): CHOL, HDL, LDLCALC, TRIG, CHOLHDL, LDLDIRECT in the last 72 hours. Thyroid Function Tests: No results for input(s): TSH, T4TOTAL, FREET4, T3FREE, THYROIDAB in the last 72 hours. Anemia Panel: No results for input(s): VITAMINB12, FOLATE, FERRITIN, TIBC, IRON, RETICCTPCT in the last 72 hours. Sepsis Labs: No results for input(s): PROCALCITON, LATICACIDVEN in the last 168 hours.  Recent Results (from the past 240 hour(s))  SARS CORONAVIRUS 2 (TAT 6-24 HRS) Nasopharyngeal     Status: None   Collection Time: 03/12/21  4:35 PM   Specimen: Nasopharyngeal  Result Value Ref Range Status   SARS Coronavirus 2 NEGATIVE NEGATIVE Final    Comment: (NOTE) SARS-CoV-2 target nucleic acids are NOT DETECTED.  The SARS-CoV-2 RNA is generally detectable in upper and lower respiratory specimens during the acute phase of infection.  Negative results do not preclude SARS-CoV-2 infection, do not rule out co-infections with other pathogens, and should not be used as the sole basis for treatment or other patient management decisions. Negative results must be combined with clinical observations, patient history, and epidemiological information. The expected result is Negative.  Fact Sheet for Patients: SugarRoll.be  Fact Sheet for Healthcare Providers: https://www.woods-mathews.com/  This test is not yet approved or cleared by the [place] FDA and  has been authorized for detection and/or diagnosis of SARS-CoV-2 by FDA under an Emergency Use Authorization (EUA). This EUA will remain  in effect (meaning this test can be used) for the duration of the COVID-19 declaration under Se ction 564(b)(1) of the Act, 21 U.S.C. section 360bbb-3(b)(1), unless the authorization is terminated or revoked sooner.  Performed at [facility]  [facility], [place] [address], [place], [place] [address]          Radiology Studies: CT HEAD WO CONTRAST (5MM)  Result Date: 03/12/2021 CLINICAL DATA:  Patient status post fall. EXAM: CT HEAD WITHOUT CONTRAST CT CERVICAL SPINE WITHOUT CONTRAST TECHNIQUE: Multidetector CT imaging of the head and cervical spine was performed following the standard protocol without intravenous contrast. Multiplanar CT image reconstructions of the cervical spine were also generated. COMPARISON:  Brain and C-spine CT 07/17/2016. FINDINGS: CT HEAD FINDINGS Brain: Ventricles and sulci are appropriate for patient's age. No evidence for acute cortically based infarct, intracranial hemorrhage, mass lesion or mass-effect. Vascular: Unremarkable Skull: Intact. Sinuses/Orbits: Polypoid mucosal thickening left maxillary sinus. Remainder the paranasal sinuses are well aerated. Mastoid air cells are unremarkable. Other: None CT CERVICAL SPINE FINDINGS Alignment: Motion artifact limits  evaluation. Normal anatomic alignment. Skull base and vertebrae: Intact. Soft tissues and spinal canal: No prevertebral fluid or swelling. No visible canal hematoma. Disc levels: No acute fracture. C4-5 and C5-6 degenerative disc disease. Multilevel facet degenerative changes. Upper chest: Subpleural scarring within the left lung apex. Other: None. IMPRESSION: No acute intracranial process. No acute cervical spine fracture Electronically Signed   By: Lovey Newcomer M.D.   On: 03/12/2021 15:08   CT Cervical Spine Wo Contrast  Result Date: 03/12/2021 CLINICAL DATA:  Patient status post fall. EXAM: CT HEAD WITHOUT CONTRAST CT CERVICAL SPINE WITHOUT CONTRAST TECHNIQUE: Multidetector CT imaging of the head and cervical spine was performed following the standard protocol without intravenous contrast. Multiplanar CT image reconstructions of the cervical spine were also generated. COMPARISON:  Brain and C-spine CT 07/17/2016. FINDINGS: CT HEAD FINDINGS Brain: Ventricles and sulci are appropriate for patient's age. No evidence for acute cortically based infarct, intracranial hemorrhage, mass lesion or mass-effect. Vascular: Unremarkable Skull: Intact. Sinuses/Orbits: Polypoid mucosal thickening left maxillary sinus. Remainder the paranasal sinuses are well aerated. Mastoid air cells are unremarkable. Other: None CT CERVICAL SPINE FINDINGS Alignment: Motion artifact limits evaluation. Normal anatomic alignment. Skull base and vertebrae: Intact. Soft tissues and spinal canal: No prevertebral fluid or swelling. No visible canal hematoma. Disc levels: No acute fracture. C4-5 and C5-6 degenerative disc disease. Multilevel facet degenerative changes. Upper chest: Subpleural scarring within the left lung apex. Other: None. IMPRESSION: No acute intracranial process. No acute cervical spine fracture Electronically Signed   By: Lovey Newcomer M.D.   On: 03/12/2021 15:08   DG Chest Portable 1 View  Result Date: 03/12/2021 CLINICAL  DATA:  Pneumonia.  Cough and shortness breath. EXAM: PORTABLE CHEST 1 VIEW COMPARISON:  Two-view chest x-ray 02/03/2018 FINDINGS: Heart is enlarged. Atherosclerotic calcifications are present at the aortic arch. Lung volumes are low. Chronic interstitial coarsening is present without edema or effusion. No focal airspace disease is present. IMPRESSION: 1. Cardiomegaly without failure. 2. Low lung volumes. Electronically Signed   By: San Morelle M.D.   On: 03/12/2021 15:15   DG HIP UNILAT WITH PELVIS 2-3 VIEWS LEFT  Result Date: 03/12/2021 CLINICAL DATA:  On yesterday.  Left hip pain. EXAM: DG HIP (WITH OR WITHOUT PELVIS) 2-3V LEFT COMPARISON:  Left femur radiographs 01/29/2011. FINDINGS: Left hip is located. No acute fracture is present. Mild osteopenia noted. Degenerative changes are present in the lower lumbar spine. Atherosclerotic calcifications are present. IMPRESSION: 1. No acute abnormality. 2. Atherosclerosis. Electronically Signed   By: San Morelle M.D.   On: 03/12/2021 16:29   DG HIP UNILAT WITH PELVIS 2-3 VIEWS RIGHT  Result Date: 03/12/2021 CLINICAL  DATA:  Syncopal episode.  Fall with bilateral hip pain. EXAM: DG HIP (WITH OR WITHOUT PELVIS) 2-3V RIGHT COMPARISON:  Radiographs 05/12/2011. FINDINGS: The bones are demineralized. There is no evidence of acute fracture, dislocation or femoral head avascular necrosis. Mild degenerative changes are present at both hips and sacroiliac joints. Vascular calcifications are noted. Possible ossification along the right iliopsoas tendon near its insertion. IMPRESSION: No evidence of acute fracture or dislocation. Electronically Signed   By: Richardean Sale M.D.   On: 03/12/2021 16:30        Scheduled Meds:  aspirin  81 mg Oral Daily   enoxaparin (LOVENOX) injection  40 mg Subcutaneous Q24H   insulin aspart  0-6 Units Subcutaneous TID WC   sodium chloride flush  3 mL Intravenous Q12H   Continuous Infusions:  ciprofloxacin 200 mg  (03/13/21 0953)     LOS: 0 days    Time spent:35 mins. More than 50% of that time was spent in counseling and/or coordination of care.      Shelly Coss, MD [facility] P9/12/2020, 1:04 PM

## 2021-03-14 DIAGNOSIS — R410 Disorientation, unspecified: Secondary | ICD-10-CM | POA: Diagnosis not present

## 2021-03-14 LAB — GLUCOSE, CAPILLARY
Glucose-Capillary: 93 mg/dL (ref 70–99)
Glucose-Capillary: 139 mg/dL — ABNORMAL HIGH (ref 70–99)
Glucose-Capillary: 117 mg/dL — ABNORMAL HIGH (ref 70–99)

## 2021-03-14 LAB — CBC WITH DIFFERENTIAL/PLATELET
Abs Immature Granulocytes: 0.04 10*3/uL (ref 0.00–0.07)
Basophils Absolute: 0 10*3/uL (ref 0.0–0.1)
Basophils Relative: 0 %
Eosinophils Absolute: 0 10*3/uL (ref 0.0–0.5)
Eosinophils Relative: 0 %
HCT: 39.1 % (ref 36.0–46.0)
Hemoglobin: 13.1 g/dL (ref 12.0–15.0)
Immature Granulocytes: 0 %
Lymphocytes Relative: 17 %
Lymphs Abs: 1.8 10*3/uL (ref 0.7–4.0)
MCH: 29.6 pg (ref 26.0–34.0)
MCHC: 33.5 g/dL (ref 30.0–36.0)
MCV: 88.3 fL (ref 80.0–100.0)
Monocytes Absolute: 1.4 10*3/uL — ABNORMAL HIGH (ref 0.1–1.0)
Monocytes Relative: 12 %
Neutro Abs: 7.6 10*3/uL (ref 1.7–7.7)
Neutrophils Relative %: 71 %
Platelets: 261 10*3/uL (ref 150–400)
RBC: 4.43 MIL/uL (ref 3.87–5.11)
RDW: 13.9 % (ref 11.5–15.5)
WBC: 10.9 10*3/uL — ABNORMAL HIGH (ref 4.0–10.5)
nRBC: 0 % (ref 0.0–0.2)

## 2021-03-14 LAB — BASIC METABOLIC PANEL
Anion gap: 8 (ref 5–15)
BUN: 14 mg/dL (ref 8–23)
CO2: 27 mmol/L (ref 22–32)
Calcium: 8.5 mg/dL — ABNORMAL LOW (ref 8.9–10.3)
Chloride: 104 mmol/L (ref 98–111)
Creatinine, Ser: 0.7 mg/dL (ref 0.44–1.00)
GFR, Estimated: >60 mL/min (ref >60)
Glucose, Bld: 104 mg/dL — ABNORMAL HIGH (ref 70–99)
Potassium: 3.7 mmol/L (ref 3.5–5.1)
Sodium: 139 mmol/L (ref 135–145)

## 2021-03-14 LAB — RESP PANEL BY RT-PCR (FLU A&B, COVID) ARPGX2
Influenza A by PCR: NEGATIVE
Influenza B by PCR: NEGATIVE
SARS Coronavirus 2 by RT PCR: NEGATIVE

## 2021-03-14 MED ORDER — CIPROFLOXACIN HCL 500 MG PO TABS: 500.0000 mg | ORAL_TABLET | Freq: Two times a day (BID) | ORAL | Status: DC

## 2021-03-14 MED ORDER — FOSFOMYCIN TROMETHAMINE 3 G PO PACK
3.0000 g | PACK | Freq: Once | ORAL | Status: AC
  Administered 2021-03-14: 3 g via ORAL
  Filled 2021-03-14: qty 3

## 2021-03-14 NOTE — TOC Progression Note (Signed)
Transition of Care [facility]) - Progression Note    Patient Details  Name: Robin Lynn MRN: [id number] Date of Birth: 12/11/1918  Transition of [facility] CM/SW [place], RN Phone Number: 03/14/2021, 11:26 AM  Clinical Narrative:   Patient can return to [facility], facility to get insurance Auth for SNF.  Seth Bake to advise when auth obtained. Family aware of return.          Expected Discharge Plan and Services           Expected Discharge Date: 03/14/21                                     Social Determinants of Health (SDOH) Interventions    Readmission Risk Interventions No flowsheet data found.

## 2021-03-14 NOTE — Progress Notes (Signed)
SLP Cancellation Note  Patient Details Name: Robin Lynn MRN: [id number] DOB: 11-30-1918   Cancelled treatment:       Reason Eval/Treat Not Completed: SLP screened, no needs identified, will sign off  Aviela Blundell B. Rutherford Nail M.S., CCC-SLP, [place] Office [phone]  Stormy Fabian 03/14/2021, 11:44 AM

## 2021-03-14 NOTE — Evaluation (Signed)
Physical Therapy Evaluation Patient Details Name: Robin Lynn MRN: [id number] DOB: 04-Dec-1918 Today's Date: 03/14/2021   History of Present Illness  Robin Lynn is a 87 y.o. female with medical history significant for dementia, Hx of breast cancer, hypertension hyperlipidemia who presents from SNF with concerns of altered mental status.    Clinical Impression  Pt admitted with above diagnosis. Pt received supine in bed initially pleasant agreeable to PT. A&O x1 to person only which appears to be pt's baseline. BP taken in supine: 127/63 mm Hg. Pt not wearing gown underneath covers.  PT placing gown on pt over covers keeping pt's privacy intact throughout session. With encouragment pt able to transfer to seated Eob with PT modA+1 for LE management and at shoulders to maintain upright sitting. Pt displays significant posterior lean requiring mod-MaxA+1 at shoulders to maintain upright sitting. Due to poor sitting tolerance and pt request, pt placed in supine with minA+1 after 20-30 sec sitting EOB. Pt quick to become agitated with sitting EOB also cussing at PT. Once returned to supine pt is pleasant to therapist. OT entering room at this time so PT transitioning pt care for OT eval. Per pt's daughter, pt's PLOF is requires assist for all bed level and OOB transfers to w/c. Pt primarily w/c user for OOB mobility using LE's to propel. Pt from [facility] and will continue to require SNF level care due to cognitive deficits and level of assist needed for functional mobility. Pt currently with functional limitations due to the deficits listed below (see PT Problem List). Pt will benefit from skilled PT to increase their independence and safety with mobility to allow discharge to the venue listed below.      Follow Up Recommendations SNF    Equipment Recommendations  None recommended by PT    Recommendations for Other Services       Precautions / Restrictions Precautions Precautions:  Fall Restrictions Weight Bearing Restrictions: No      Mobility  Bed Mobility Overal bed mobility: Needs Assistance Bed Mobility: Supine to Sit     Supine to sit: Mod assist     General bed mobility comments: Relies on hand over hand cuing. Difficulty swinging LE's towards EOB.    Transfers                 General transfer comment: Deferred due to difficulty maintaining static sitting balance. Requesting to lay down.  Ambulation/Gait             General Gait Details: Deferred  Stairs            Wheelchair Mobility    Modified Rankin (Stroke Patients Only)       Balance Overall balance assessment: Needs assistance Sitting-balance support: Feet supported;Bilateral upper extremity supported Sitting balance-Leahy Scale: Poor Sitting balance - Comments: Requires modA+1 at shoulders to maintain upright Postural control: Posterior lean     Standing balance comment: Deferred                             Pertinent Vitals/Pain Pain Assessment: No/denies pain    Home Living Family/patient expects to be discharged to:: Assisted living               Home Equipment: Walker - 4 wheels Additional Comments: From SNF [facility])    Prior Function Level of Independence: Needs assistance         Comments: Relies on physical assist for  all transfers. Per pt's daughter, pt able to use LE's to propel herself in manual W/c.     Hand Dominance        Extremity/Trunk Assessment   Upper Extremity Assessment Upper Extremity Assessment: Generalized weakness;Defer to OT evaluation    Lower Extremity Assessment Lower Extremity Assessment: Generalized weakness    Cervical / Trunk Assessment Cervical / Trunk Assessment: Kyphotic  Communication   Communication: No difficulties  Cognition     Overall Cognitive Status: History of cognitive impairments - at baseline                                        General  Comments      Exercises Other Exercises Other Exercises: Role of PT in acute setting   Assessment/Plan    PT Assessment Patient needs continued PT services  PT Problem List Decreased strength;Decreased mobility;Decreased safety awareness;Decreased activity tolerance;Decreased cognition       PT Treatment Interventions      PT Goals (Current goals can be found in the Care Plan section)  Acute Rehab PT Goals PT Goal Formulation: Patient unable to participate in goal setting    Frequency Min 2X/week   Barriers to discharge        Co-evaluation               AM-PAC PT "6 Clicks" Mobility  Outcome Measure Help needed turning from your back to your side while in a flat bed without using bedrails?: A Lot Help needed moving from lying on your back to sitting on the side of a flat bed without using bedrails?: A Lot Help needed moving to and from a bed to a chair (including a wheelchair)?: Total Help needed standing up from a chair using your arms (e.g., wheelchair or bedside chair)?: A Lot Help needed to walk in hospital room?: Total Help needed climbing 3-5 steps with a railing? : Total 6 Click Score: 9    End of Session   Activity Tolerance: Treatment limited secondary to agitation Patient left: in bed;Other (comment) (in care of OT) Nurse Communication: Mobility status PT Visit Diagnosis: Muscle weakness (generalized) (M62.81);Difficulty in walking, not elsewhere classified (R26.2)    Time: [id number] PT Time Calculation (min) (ACUTE ONLY): 17 min   Charges:   PT Evaluation $PT Eval Low Complexity: 1 Low PT Treatments $Therapeutic Activity: 8-22 mins       Taliyah Watrous M. Fairly IV, PT, DPT Physical Therapist- [facility]  03/14/2021, 10:24 AM

## 2021-03-14 NOTE — NC FL2 (Signed)
  [place] LEVEL OF CARE SCREENING TOOL     IDENTIFICATION  Patient Name: Robin Lynn Birthdate: 08/06/1918 Sex: female Admission Date (Current Location): 03/12/2021  [facility] and [place] Number:  Engineering geologist and Address:  [facility], [address] [place], [place] [address]      Provider Number: [id number]  Attending Physician Name and Address:  Shelly Coss, MD  Relative Name and Phone Number:  Liz Malady (Daughter)   [phone] (Mobile)    Current Level of Care: SNF Recommended Level of Care: [place] Prior Approval Number:    Date Approved/Denied:   PASRR Number: [id number]  Discharge Plan: SNF    Current Diagnoses: Patient Active Problem List   Diagnosis Date Noted   AMS (altered mental status) 03/12/2021   Syncope and collapse 02/06/2018   History of breast cancer 12/05/2017   Advance directive discussed with patient 08/21/2016   Vascular dementia without behavioral disturbance ([place]) 02/14/2016   HTN (hypertension) 03/23/2015   HLD (hyperlipidemia) 03/23/2015    Orientation RESPIRATION BLADDER Height & Weight     Self  Normal (96% on room air) Incontinent Weight: 60.3 kg Height:     BEHAVIORAL SYMPTOMS/MOOD NEUROLOGICAL BOWEL NUTRITION STATUS      Incontinent Diet (regular)  AMBULATORY STATUS COMMUNICATION OF NEEDS Skin   Total Care Verbally Normal (Generalized ecchymosis)                       Personal Care Assistance Level of Assistance  Bathing, Feeding, Dressing Bathing Assistance: Maximum assistance Feeding assistance: Limited assistance Dressing Assistance: Maximum assistance     Functional Limitations Info  Sight, Hearing, Speech Sight Info: Adequate Hearing Info: Impaired Speech Info: Adequate    SPECIAL CARE FACTORS FREQUENCY  PT (By licensed PT), OT (By licensed OT)     PT Frequency: 5x weekly OT Frequency: 5x weekly            Contractures  Contractures Info: Not present    Additional Factors Info  Code Status, Allergies Code Status Info: DNR Allergies Info: Penicillins           Current Medications (03/14/2021):  This is the current hospital active medication list Current Facility-Administered Medications  Medication Dose Route Frequency Provider Last Rate Last Admin   acetaminophen (TYLENOL) tablet 500 mg  500 mg Oral Q4H PRN Tu, Ching T, DO       aspirin chewable tablet 81 mg  81 mg Oral Daily Tu, Ching T, DO   81 mg at 03/14/21 0802   enoxaparin (LOVENOX) injection 40 mg  40 mg Subcutaneous Q24H Tu, Ching T, DO   40 mg at 03/13/21 1608   insulin aspart (novoLOG) injection 0-6 Units  0-6 Units Subcutaneous TID WC Tu, Ching T, DO       sodium chloride flush (NS) 0.9 % injection 3 mL  3 mL Intravenous Q12H Tu, Ching T, DO   3 mL at 03/14/21 0802     Discharge Medications: Please see discharge summary for a list of discharge medications.  Relevant Imaging Results:  Relevant Lab Results:   Additional Information SSN [id number]  Pete Pelt, RN

## 2021-03-14 NOTE — Evaluation (Signed)
Occupational Therapy Evaluation Patient Details Name: Robin Lynn MRN: [id number] DOB: 1919-06-20 Today's Date: 03/14/2021    History of Present Illness Robin Lynn is a 87 y.o. female with medical history significant for dementia, Hx of breast cancer, hypertension hyperlipidemia who presents from SNF with concerns of altered mental status.   Clinical Impression   Pt seen for OT evaluation this date, immediately after PT finished. Pt oriented to self, able to follow simple commands with short, repeated commands to support decision making. Pt required MOD A for bed positioning to improve upright for drinking coffee. Pt performed grooming tasks with set up and supervision. Pt may benefit from additional skilled OT services to maximize safety/indep.     Follow Up Recommendations  SNF (back to SNF she resides at)    Equipment Recommendations       Recommendations for Other Services       Precautions / Restrictions Precautions Precautions: Fall Restrictions Weight Bearing Restrictions: No      Mobility Bed Mobility Overal bed mobility: Needs Assistance Bed Mobility:      General bed mobility comments: Pt required MOD A for repositioning in bed wiht cues for sequencing    Transfers                     Balance                            ADL either performed or assessed with clinical judgement   ADL Overall ADL's : Needs assistance/impaired                                       General ADL Comments: Pt currently requires MOD-MAX A for LB ADL, anticipate MAX A for ADL SPT     Vision         Perception     Praxis      Pertinent Vitals/Pain Pain Assessment: No/denies pain     Hand Dominance     Extremity/Trunk Assessment Upper Extremity Assessment Upper Extremity Assessment: Generalized weakness   Lower Extremity Assessment Lower Extremity Assessment: Generalized weakness   Cervical / Trunk Assessment Cervical /  Trunk Assessment: Kyphotic   Communication Communication Communication: No difficulties   Cognition Arousal/Alertness: Awake/alert Behavior During Therapy: WFL for tasks assessed/performed Overall Cognitive Status: History of cognitive impairments - at baseline                                 General Comments: oriented to self, follows simple commands with verbal/visual cues   General Comments       Exercises Other Exercises Other Exercises: Role of PT in acute setting Other Exercises: Pt performed grooming and self feeding tasks with setup and supervision from long sitting position in bed   Shoulder Instructions      Home Living Family/patient expects to be discharged to:: Assisted living                             Home Equipment: Walker - 4 wheels   Additional Comments: From SNF [facility])      Prior Functioning/Environment Level of Independence: Needs assistance    ADL's / Homemaking Assistance Needed: assist for ADL, unclear extent of assist  Comments: Relies on physical assist for all transfers. Per pt's daughter, pt able to use LE's to propel herself in manual W/c.        OT Problem List: Decreased strength;Decreased cognition;Decreased safety awareness;Impaired balance (sitting and/or standing);Decreased knowledge of use of DME or AE      OT Treatment/Interventions: Self-care/ADL training;Therapeutic activities;Therapeutic exercise;Cognitive remediation/compensation;DME and/or AE instruction;Patient/family education;Balance training    OT Goals(Current goals can be found in the care plan section) Acute Rehab OT Goals Patient Stated Goal: to back home OT Goal Formulation: With patient Time For Goal Achievement: 03/28/21 Potential to Achieve Goals: Good ADL Goals Pt Will Perform Grooming: with set-up;with supervision;sitting Additional ADL Goal #1: Pt will perform squat/stand pivot transfers to/from w/c and BSC to support  maximizing safety for ADL participation requiring mod A.  OT Frequency: Min 2X/week   Barriers to D/C:            Co-evaluation              AM-PAC OT "6 Clicks" Daily Activity     Outcome Measure Help from another person eating meals?: A Little Help from another person taking care of personal grooming?: A Little Help from another person toileting, which includes using toliet, bedpan, or urinal?: A Lot Help from another person bathing (including washing, rinsing, drying)?: A Lot Help from another person to put on and taking off regular upper body clothing?: A Little Help from another person to put on and taking off regular lower body clothing?: A Lot 6 Click Score: 15   End of Session    Activity Tolerance: Patient tolerated treatment well Patient left: in bed;with call bell/phone within reach;with bed alarm set  OT Visit Diagnosis: Other abnormalities of gait and mobility (R26.89);Muscle weakness (generalized) (M62.81)                Time: [id number] OT Time Calculation (min): 11 min Charges:  OT General Charges $OT Visit: 1 Visit OT Evaluation $OT Eval Moderate Complexity: 1 Mod  Hanley Hays, MPH, MS, OTR/L ascom [phone] 03/14/21, 10:43 AM

## 2021-03-14 NOTE — Progress Notes (Signed)
Physician Discharge Summary  Robin Lynn [id number] DOB: 1919/04/23 DOA: 03/12/2021  PCP: Venia Carbon, MD  Admit date: 03/12/2021 Discharge date: 03/14/2021  Admitted From: Home Disposition:  Home  Discharge Condition:Stable CODE STATUS: DNR Diet recommendation: Regular  Brief/Interim Summary:  Patient is 87 year old female with history of dementia, history of breast cancer, hypertension, hyperlipidemia who was brought from skilled nursing facility with concerns of change in mental status.  On baseline, patient is confused from dementia.  She is alert and oriented to self only.  There was also report of fall at her skilled nursing facility.  Patient was given tramadol at nursing facility, she was then found to be slumped over in her chair with labored respiration and also vomited multiple times.  On presentation she was hemodynamically stable.  She had mild leukocytosis.  CT head/cervical spine was negative.  Chest x-ray was negative for acute findings.  UA was suggestive of UTI, urine culture showed 40,000 colonies of Pseudomonas.  She was treated with a dose of fosfomycin.  She is medically stable for discharge back to skilled facility today.  Following problems were addressed during her hospitalization:  Altered mental status/confusion: On baseline, patient is confused from dementia.  She is alert and oriented to self only.  There was also report of fall at her skilled nursing facility.  Patient was given tramadol at nursing facility, she was then found to be slumped over in her chair with labored respiration and also vomited multiple times. CT head/cervical spine was negative.  Chest x-ray was negative for acute findings.  Ammonia level normal Her mental status looks improved already and she is at baseline.   Suspected UTI: No fever.  Had mild leukocytosis on presentation,now improved. UA was suggestive of UTI, urine culture showed 40,000 colonies of Pseudomonas.  She was treated  with a dose of fosfomycin.   Report of fall: Skilled nursing facility resident.  Pelvic x-ray did not show any acute fractures.PT/OT recommended discussion with patient discharge   Hypertension: Currently blood pressure well controlled without any medications.  Evidently she is not taking any medication at skilled facility   Hyperglycemia: A1c of 5.8.  Monitor blood sugars   Debility/deconditioning: SNF resident.  Has dementia which looks like advanced.  Plan is to send her back to skilled nursing facility.    Discharge Diagnoses:  Active Problems:   AMS (altered mental status)    Discharge Instructions  Discharge Instructions     Diet general   Complete by: As directed    Discharge instructions   Complete by: As directed    1)Please take your medications as instructed   Increase activity slowly   Complete by: As directed       Allergies as of 03/14/2021       Reactions   Penicillins         Medication List     STOP taking these medications    bismuth subsalicylate [id number] suspension Commonly known as: PEPTO BISMOL   co-enzyme Q-10 30 MG capsule   escitalopram 10 MG tablet Commonly known as: LEXAPRO   guaiFENesin-dextromethorphan 100-10 MG/5ML syrup Commonly known as: ROBITUSSIN DM   losartan 100 MG tablet Commonly known as: COZAAR       TAKE these medications    acetaminophen 325 MG tablet Commonly known as: TYLENOL Take 325-650 mg by mouth every 4 (four) hours as needed.   alum & mag hydroxide-simeth 200-200-20 MG/5ML suspension Commonly known as: MAALOX/MYLANTA Take 30 mLs by mouth  every 4 (four) hours as needed for indigestion or heartburn.   artificial tears Oint ophthalmic ointment Commonly known as: LACRILUBE Place 1 application into both eyes 3 (three) times daily.   aspirin 81 MG chewable tablet Chew 81 mg by mouth daily.   loratadine 10 MG tablet Commonly known as: CLARITIN Take 10 mg by mouth daily as needed for  allergies.   magnesium hydroxide 400 MG/5ML suspension Commonly known as: MILK OF MAGNESIA Take 30 mLs by mouth daily as needed for mild constipation.   nystatin powder Commonly known as: MYCOSTATIN/NYSTOP Apply 1 g topically 2 (two) times daily. To folds and under the breasts   polyethylene glycol 17 g packet Commonly known as: MIRALAX / GLYCOLAX Take 17 g by mouth daily as needed.   therapeutic multivitamin-minerals tablet Take 1 tablet by mouth daily.   traMADol 50 MG tablet Commonly known as: ULTRAM Take 50 mg by mouth 3 (three) times daily as needed.   Tubersol 5 UNIT/0.1ML injection Generic drug: tuberculin Inject 0.1 mLs into the skin once. Once every 12 months   valACYclovir 500 MG tablet Commonly known as: VALTREX Take 500 mg by mouth daily.   Vitamin D (Cholecalciferol) 25 MCG (1000 UT) Caps Take by mouth.   vitamin E 180 MG (400 UNITS) capsule Take 400 Units by mouth daily.        Allergies  Allergen Reactions   Penicillins     Consultations: None   Procedures/Studies: CT HEAD WO CONTRAST (5MM)  Result Date: 03/12/2021 CLINICAL DATA:  Patient status post fall. EXAM: CT HEAD WITHOUT CONTRAST CT CERVICAL SPINE WITHOUT CONTRAST TECHNIQUE: Multidetector CT imaging of the head and cervical spine was performed following the standard protocol without intravenous contrast. Multiplanar CT image reconstructions of the cervical spine were also generated. COMPARISON:  Brain and C-spine CT 07/17/2016. FINDINGS: CT HEAD FINDINGS Brain: Ventricles and sulci are appropriate for patient's age. No evidence for acute cortically based infarct, intracranial hemorrhage, mass lesion or mass-effect. Vascular: Unremarkable Skull: Intact. Sinuses/Orbits: Polypoid mucosal thickening left maxillary sinus. Remainder the paranasal sinuses are well aerated. Mastoid air cells are unremarkable. Other: None CT CERVICAL SPINE FINDINGS Alignment: Motion artifact limits evaluation. Normal  anatomic alignment. Skull base and vertebrae: Intact. Soft tissues and spinal canal: No prevertebral fluid or swelling. No visible canal hematoma. Disc levels: No acute fracture. C4-5 and C5-6 degenerative disc disease. Multilevel facet degenerative changes. Upper chest: Subpleural scarring within the left lung apex. Other: None. IMPRESSION: No acute intracranial process. No acute cervical spine fracture Electronically Signed   By: Lovey Newcomer M.D.   On: 03/12/2021 15:08   CT Cervical Spine Wo Contrast  Result Date: 03/12/2021 CLINICAL DATA:  Patient status post fall. EXAM: CT HEAD WITHOUT CONTRAST CT CERVICAL SPINE WITHOUT CONTRAST TECHNIQUE: Multidetector CT imaging of the head and cervical spine was performed following the standard protocol without intravenous contrast. Multiplanar CT image reconstructions of the cervical spine were also generated. COMPARISON:  Brain and C-spine CT 07/17/2016. FINDINGS: CT HEAD FINDINGS Brain: Ventricles and sulci are appropriate for patient's age. No evidence for acute cortically based infarct, intracranial hemorrhage, mass lesion or mass-effect. Vascular: Unremarkable Skull: Intact. Sinuses/Orbits: Polypoid mucosal thickening left maxillary sinus. Remainder the paranasal sinuses are well aerated. Mastoid air cells are unremarkable. Other: None CT CERVICAL SPINE FINDINGS Alignment: Motion artifact limits evaluation. Normal anatomic alignment. Skull base and vertebrae: Intact. Soft tissues and spinal canal: No prevertebral fluid or swelling. No visible canal hematoma. Disc levels: No acute fracture. C4-5  and C5-6 degenerative disc disease. Multilevel facet degenerative changes. Upper chest: Subpleural scarring within the left lung apex. Other: None. IMPRESSION: No acute intracranial process. No acute cervical spine fracture Electronically Signed   By: Lovey Newcomer M.D.   On: 03/12/2021 15:08   DG Chest Portable 1 View  Result Date: 03/12/2021 CLINICAL DATA:  Pneumonia.   Cough and shortness breath. EXAM: PORTABLE CHEST 1 VIEW COMPARISON:  Two-view chest x-ray 02/03/2018 FINDINGS: Heart is enlarged. Atherosclerotic calcifications are present at the aortic arch. Lung volumes are low. Chronic interstitial coarsening is present without edema or effusion. No focal airspace disease is present. IMPRESSION: 1. Cardiomegaly without failure. 2. Low lung volumes. Electronically Signed   By: San Morelle M.D.   On: 03/12/2021 15:15   DG HIP UNILAT WITH PELVIS 2-3 VIEWS LEFT  Result Date: 03/12/2021 CLINICAL DATA:  On yesterday.  Left hip pain. EXAM: DG HIP (WITH OR WITHOUT PELVIS) 2-3V LEFT COMPARISON:  Left femur radiographs 01/29/2011. FINDINGS: Left hip is located. No acute fracture is present. Mild osteopenia noted. Degenerative changes are present in the lower lumbar spine. Atherosclerotic calcifications are present. IMPRESSION: 1. No acute abnormality. 2. Atherosclerosis. Electronically Signed   By: San Morelle M.D.   On: 03/12/2021 16:29   DG HIP UNILAT WITH PELVIS 2-3 VIEWS RIGHT  Result Date: 03/12/2021 CLINICAL DATA:  Syncopal episode.  Fall with bilateral hip pain. EXAM: DG HIP (WITH OR WITHOUT PELVIS) 2-3V RIGHT COMPARISON:  Radiographs 05/12/2011. FINDINGS: The bones are demineralized. There is no evidence of acute fracture, dislocation or femoral head avascular necrosis. Mild degenerative changes are present at both hips and sacroiliac joints. Vascular calcifications are noted. Possible ossification along the right iliopsoas tendon near its insertion. IMPRESSION: No evidence of acute fracture or dislocation. Electronically Signed   By: Richardean Sale M.D.   On: 03/12/2021 16:30      Subjective: Patient seen and examined the bedside this morning.  Hemodynamically stable.  Comfortable.  Alert and awake, oriented to place.  Stable for discharge today.  Discharge Exam: Vitals:   03/14/21 0618 03/14/21 0725  BP: 132/88 126/68  Pulse: 100 93  Resp: 18  18  Temp: 97.8 F (36.6 C) (!) 97.5 F (36.4 C)  SpO2: 98% 98%   Vitals:   03/13/21 2047 03/14/21 0000 03/14/21 0618 03/14/21 0725  BP: 120/78 128/74 132/88 126/68  Pulse: 100 98 100 93  Resp: '18 16 18 18  '$ Temp: 98.9 F (37.2 C) 98.7 F (37.1 C) 97.8 F (36.6 C) (!) 97.5 F (36.4 C)  TempSrc:  Oral    SpO2: 90% 96% 98% 98%  Weight:        General: Pt is alert, awake, not in acute distress Cardiovascular: RRR, S1/S2 +, no rubs, no gallops Respiratory: CTA bilaterally, no wheezing, no rhonchi Abdominal: Soft, NT, ND, bowel sounds + Extremities: no edema, no cyanosis    The results of significant diagnostics from this hospitalization (including imaging, microbiology, ancillary and laboratory) are listed below for reference.     Microbiology: Recent Results (from the past 240 hour(s))  Urine Culture     Status: Abnormal (Preliminary result)   Collection Time: 03/12/21  4:34 PM   Specimen: In/Out Cath Urine  Result Value Ref Range Status   Specimen Description   Final    IN/OUT CATH URINE Performed at [facility], [address] [place], [place] [address]    Special Requests   Final    NONE Performed at [facility]  [facility], [place], [place] [address]    Culture 40,000 COLONIES/mL PSEUDOMONAS AERUGINOSA (A)  Final   Report Status PENDING  Incomplete  SARS CORONAVIRUS 2 (TAT 6-24 HRS) Nasopharyngeal     Status: None   Collection Time: 03/12/21  4:35 PM   Specimen: Nasopharyngeal  Result Value Ref Range Status   SARS Coronavirus 2 NEGATIVE NEGATIVE Final    Comment: (NOTE) SARS-CoV-2 target nucleic acids are NOT DETECTED.  The SARS-CoV-2 RNA is generally detectable in upper and lower respiratory specimens during the acute phase of infection. Negative results do not preclude SARS-CoV-2 infection, do not rule out co-infections with other pathogens, and should not be used as the sole basis for treatment or other patient management  decisions. Negative results must be combined with clinical observations, patient history, and epidemiological information. The expected result is Negative.  Fact Sheet for Patients: SugarRoll.be  Fact Sheet for Healthcare Providers: https://www.woods-mathews.com/  This test is not yet approved or cleared by the [place] FDA and  has been authorized for detection and/or diagnosis of SARS-CoV-2 by FDA under an Emergency Use Authorization (EUA). This EUA will remain  in effect (meaning this test can be used) for the duration of the COVID-19 declaration under Se ction 564(b)(1) of the Act, 21 U.S.C. section 360bbb-3(b)(1), unless the authorization is terminated or revoked sooner.  Performed at [facility], [place] [address], [place], [place] [address]      Labs: BNP (last 3 results) No results for input(s): BNP in the last 8760 hours. Basic Metabolic Panel: Recent Labs  Lab 03/12/21 1413 03/14/21 0348  NA 134* 139  K 3.7 3.7  CL 102 104  CO2 24 27  GLUCOSE 171* 104*  BUN 13 14  CREATININE 0.80 0.70  CALCIUM 8.4* 8.5*   Liver Function Tests: Recent Labs  Lab 03/12/21 1413  AST 23  ALT 13  ALKPHOS 87  BILITOT 0.7  PROT 6.2*  ALBUMIN 3.5   No results for input(s): LIPASE, AMYLASE in the last 168 hours. Recent Labs  Lab 03/12/21 1634  AMMONIA 13   CBC: Recent Labs  Lab 03/12/21 1413 03/14/21 0348  WBC 13.4* 10.9*  NEUTROABS 8.5* 7.6  HGB 13.3 13.1  HCT 39.3 39.1  MCV 87.7 88.3  PLT 286 261   Cardiac Enzymes: No results for input(s): CKTOTAL, CKMB, CKMBINDEX, TROPONINI in the last 168 hours. BNP: Invalid input(s): POCBNP CBG: Recent Labs  Lab 03/13/21 0857 03/13/21 1155 03/13/21 1604 03/13/21 2049 03/14/21 0830  GLUCAP 115* 111* 95 90 93   D-Dimer No results for input(s): DDIMER in the last 72 hours. Hgb A1c Recent Labs    03/12/21 1413  HGBA1C 5.8*   Lipid Profile No results for  input(s): CHOL, HDL, LDLCALC, TRIG, CHOLHDL, LDLDIRECT in the last 72 hours. Thyroid function studies No results for input(s): TSH, T4TOTAL, T3FREE, THYROIDAB in the last 72 hours.  Invalid input(s): FREET3 Anemia work up No results for input(s): VITAMINB12, FOLATE, FERRITIN, TIBC, IRON, RETICCTPCT in the last 72 hours. Urinalysis    Component Value Date/Time   COLORURINE YELLOW 03/12/2021 1634   APPEARANCEUR CLOUDY (A) 03/12/2021 1634   LABSPEC 1.025 03/12/2021 1634   PHURINE 6.5 03/12/2021 1634   GLUCOSEU NEGATIVE 03/12/2021 1634   HGBUR TRACE (A) 03/12/2021 1634   BILIRUBINUR NEGATIVE 03/12/2021 1634   KETONESUR TRACE (A) 03/12/2021 1634   PROTEINUR TRACE (A) 03/12/2021 1634   NITRITE NEGATIVE 03/12/2021 1634   LEUKOCYTESUR MODERATE (A) 03/12/2021 1634  Sepsis Labs Invalid input(s): PROCALCITONIN,  WBC,  LACTICIDVEN Microbiology Recent Results (from the past 240 hour(s))  Urine Culture     Status: Abnormal (Preliminary result)   Collection Time: 03/12/21  4:34 PM   Specimen: In/Out Cath Urine  Result Value Ref Range Status   Specimen Description   Final    IN/OUT CATH URINE Performed at [facility], [address] [place], [place] [address]    Special Requests   Final    NONE Performed at [facility], [place], [place] [address]    Culture 40,000 COLONIES/mL PSEUDOMONAS AERUGINOSA (A)  Final   Report Status PENDING  Incomplete  SARS CORONAVIRUS 2 (TAT 6-24 HRS) Nasopharyngeal     Status: None   Collection Time: 03/12/21  4:35 PM   Specimen: Nasopharyngeal  Result Value Ref Range Status   SARS Coronavirus 2 NEGATIVE NEGATIVE Final    Comment: (NOTE) SARS-CoV-2 target nucleic acids are NOT DETECTED.  The SARS-CoV-2 RNA is generally detectable in upper and lower respiratory specimens during the acute phase of infection. Negative results do not preclude SARS-CoV-2 infection, do not rule out co-infections with other pathogens,  and should not be used as the sole basis for treatment or other patient management decisions. Negative results must be combined with clinical observations, patient history, and epidemiological information. The expected result is Negative.  Fact Sheet for Patients: SugarRoll.be  Fact Sheet for Healthcare Providers: https://www.woods-mathews.com/  This test is not yet approved or cleared by the [place] FDA and  has been authorized for detection and/or diagnosis of SARS-CoV-2 by FDA under an Emergency Use Authorization (EUA). This EUA will remain  in effect (meaning this test can be used) for the duration of the COVID-19 declaration under Se ction 564(b)(1) of the Act, 21 U.S.C. section 360bbb-3(b)(1), unless the authorization is terminated or revoked sooner.  Performed at [facility], [place] [address], [place], [place] [address]     Please note: You were cared for by a hospitalist during your hospital stay. Once you are discharged, your primary care physician will handle any further medical issues. Please note that NO REFILLS for any discharge medications will be authorized once you are discharged, as it is imperative that you return to your primary care physician (or establish a relationship with a primary care physician if you do not have one) for your post hospital discharge needs so that they can reassess your need for medications and monitor your lab values.    Time coordinating discharge: 40 minutes  SIGNED:   Shelly Coss, MD  [facility] 03/14/2021, 10:53 AM Pager [phone]  If 7PM-7AM, please contact night-coverage [email or web address] Password [email or web address]

## 2021-03-15 DIAGNOSIS — R410 Disorientation, unspecified: Secondary | ICD-10-CM | POA: Diagnosis not present

## 2021-03-15 LAB — URINE CULTURE: Culture: 40000 — AB

## 2021-03-15 LAB — GLUCOSE, CAPILLARY
Glucose-Capillary: 100 mg/dL — ABNORMAL HIGH (ref 70–99)
Glucose-Capillary: 122 mg/dL — ABNORMAL HIGH (ref 70–99)
Glucose-Capillary: 70 mg/dL (ref 70–99)

## 2021-03-15 NOTE — Progress Notes (Signed)
Called report to [employer] at [facility]

## 2021-03-15 NOTE — Progress Notes (Signed)
Cbg 72 set pt dinner tray up and pt fed herself 50% meal

## 2021-03-15 NOTE — Discharge Summary (Signed)
Physician Discharge Summary  Robin Lynn [id number] DOB: May 09, 1919 DOA: 03/12/2021   PCP: Venia Carbon, MD   Admit date: 03/12/2021 Discharge date: 03/15/21   Admitted From: SNF Disposition: SNF   Discharge Condition:Stable CODE STATUS: DNR Diet recommendation: Regular   Brief/Interim Summary:   Patient is 87 year old female with history of dementia, history of breast cancer, hypertension, hyperlipidemia who was brought from skilled nursing facility with concerns of change in mental status.  On baseline, patient is confused from dementia.  She is alert and oriented to self only.  There was also report of fall at her skilled nursing facility.  Patient was given tramadol at nursing facility, she was then found to be slumped over in her chair with labored respiration and also vomited multiple times.  On presentation she was hemodynamically stable.  She had mild leukocytosis.  CT head/cervical spine was negative.  Chest x-ray was negative for acute findings.  UA was suggestive of UTI, urine culture showed 40,000 colonies of Pseudomonas.  She was treated with a dose of fosfomycin.  She is medically stable for discharge back to skilled facility today.   Following problems were addressed during her hospitalization:   Altered mental status/confusion: On baseline, patient is confused from dementia.  She is alert and oriented to self only.  There was also report of fall at her skilled nursing facility.  Patient was given tramadol at nursing facility, she was then found to be slumped over in her chair with labored respiration and also vomited multiple times. CT head/cervical spine was negative.  Chest x-ray was negative for acute findings.  Ammonia level normal Her mental status looks improved already and she is at baseline.   Suspected UTI: No fever.  Had mild leukocytosis on presentation,now improved. UA was suggestive of UTI, urine culture showed 40,000 colonies of Pseudomonas.  She was treated  with a dose of fosfomycin.   Report of fall: Skilled nursing facility resident.  Pelvic x-ray did not show any acute fractures.PT/OT recommended discussion with patient discharge   Hypertension: Currently blood pressure well controlled without any medications.  Evidently she is not taking any medication at skilled facility   Hyperglycemia: A1c of 5.8.  Monitor blood sugars   Debility/deconditioning: SNF resident.  Has dementia which looks like advanced.  Plan is to send her back to skilled nursing facility.       Discharge Diagnoses:  Active Problems:   AMS (altered mental status)       Discharge Instructions   Discharge Instructions       Diet general   Complete by: As directed      Discharge instructions   Complete by: As directed      1)Please take your medications as instructed    Increase activity slowly   Complete by: As directed           Allergies as of 03/14/2021         Reactions    Penicillins              Medication List       STOP taking these medications     bismuth subsalicylate [id number] suspension Commonly known as: PEPTO BISMOL    co-enzyme Q-10 30 MG capsule    escitalopram 10 MG tablet Commonly known as: LEXAPRO    guaiFENesin-dextromethorphan 100-10 MG/5ML syrup Commonly known as: ROBITUSSIN DM    losartan 100 MG tablet Commonly known as: COZAAR  TAKE these medications     acetaminophen 325 MG tablet Commonly known as: TYLENOL Take 325-650 mg by mouth every 4 (four) hours as needed.    alum & mag hydroxide-simeth 200-200-20 MG/5ML suspension Commonly known as: MAALOX/MYLANTA Take 30 mLs by mouth every 4 (four) hours as needed for indigestion or heartburn.    artificial tears Oint ophthalmic ointment Commonly known as: LACRILUBE Place 1 application into both eyes 3 (three) times daily.    aspirin 81 MG chewable tablet Chew 81 mg by mouth daily.    loratadine 10 MG tablet Commonly known as: CLARITIN Take 10  mg by mouth daily as needed for allergies.    magnesium hydroxide 400 MG/5ML suspension Commonly known as: MILK OF MAGNESIA Take 30 mLs by mouth daily as needed for mild constipation.    nystatin powder Commonly known as: MYCOSTATIN/NYSTOP Apply 1 g topically 2 (two) times daily. To folds and under the breasts    polyethylene glycol 17 g packet Commonly known as: MIRALAX / GLYCOLAX Take 17 g by mouth daily as needed.    therapeutic multivitamin-minerals tablet Take 1 tablet by mouth daily.    traMADol 50 MG tablet Commonly known as: ULTRAM Take 50 mg by mouth 3 (three) times daily as needed.    Tubersol 5 UNIT/0.1ML injection Generic drug: tuberculin Inject 0.1 mLs into the skin once. Once every 12 months    valACYclovir 500 MG tablet Commonly known as: VALTREX Take 500 mg by mouth daily.    Vitamin D (Cholecalciferol) 25 MCG (1000 UT) Caps Take by mouth.    vitamin E 180 MG (400 UNITS) capsule Take 400 Units by mouth daily.                 Allergies  Allergen Reactions   Penicillins        Consultations: None     Procedures/Studies: CT HEAD WO CONTRAST (5MM)   Result Date: 03/12/2021 CLINICAL DATA:  Patient status post fall. EXAM: CT HEAD WITHOUT CONTRAST CT CERVICAL SPINE WITHOUT CONTRAST TECHNIQUE: Multidetector CT imaging of the head and cervical spine was performed following the standard protocol without intravenous contrast. Multiplanar CT image reconstructions of the cervical spine were also generated. COMPARISON:  Brain and C-spine CT 07/17/2016. FINDINGS: CT HEAD FINDINGS Brain: Ventricles and sulci are appropriate for patient's age. No evidence for acute cortically based infarct, intracranial hemorrhage, mass lesion or mass-effect. Vascular: Unremarkable Skull: Intact. Sinuses/Orbits: Polypoid mucosal thickening left maxillary sinus. Remainder the paranasal sinuses are well aerated. Mastoid air cells are unremarkable. Other: None CT CERVICAL SPINE FINDINGS  Alignment: Motion artifact limits evaluation. Normal anatomic alignment. Skull base and vertebrae: Intact. Soft tissues and spinal canal: No prevertebral fluid or swelling. No visible canal hematoma. Disc levels: No acute fracture. C4-5 and C5-6 degenerative disc disease. Multilevel facet degenerative changes. Upper chest: Subpleural scarring within the left lung apex. Other: None. IMPRESSION: No acute intracranial process. No acute cervical spine fracture Electronically Signed   By: Lovey Newcomer M.D.   On: 03/12/2021 15:08    CT Cervical Spine Wo Contrast   Result Date: 03/12/2021 CLINICAL DATA:  Patient status post fall. EXAM: CT HEAD WITHOUT CONTRAST CT CERVICAL SPINE WITHOUT CONTRAST TECHNIQUE: Multidetector CT imaging of the head and cervical spine was performed following the standard protocol without intravenous contrast. Multiplanar CT image reconstructions of the cervical spine were also generated. COMPARISON:  Brain and C-spine CT 07/17/2016. FINDINGS: CT HEAD FINDINGS Brain: Ventricles and sulci are appropriate for patient's age. No  evidence for acute cortically based infarct, intracranial hemorrhage, mass lesion or mass-effect. Vascular: Unremarkable Skull: Intact. Sinuses/Orbits: Polypoid mucosal thickening left maxillary sinus. Remainder the paranasal sinuses are well aerated. Mastoid air cells are unremarkable. Other: None CT CERVICAL SPINE FINDINGS Alignment: Motion artifact limits evaluation. Normal anatomic alignment. Skull base and vertebrae: Intact. Soft tissues and spinal canal: No prevertebral fluid or swelling. No visible canal hematoma. Disc levels: No acute fracture. C4-5 and C5-6 degenerative disc disease. Multilevel facet degenerative changes. Upper chest: Subpleural scarring within the left lung apex. Other: None. IMPRESSION: No acute intracranial process. No acute cervical spine fracture Electronically Signed   By: Lovey Newcomer M.D.   On: 03/12/2021 15:08    DG Chest Portable 1 View    Result Date: 03/12/2021 CLINICAL DATA:  Pneumonia.  Cough and shortness breath. EXAM: PORTABLE CHEST 1 VIEW COMPARISON:  Two-view chest x-ray 02/03/2018 FINDINGS: Heart is enlarged. Atherosclerotic calcifications are present at the aortic arch. Lung volumes are low. Chronic interstitial coarsening is present without edema or effusion. No focal airspace disease is present. IMPRESSION: 1. Cardiomegaly without failure. 2. Low lung volumes. Electronically Signed   By: San Morelle M.D.   On: 03/12/2021 15:15    DG HIP UNILAT WITH PELVIS 2-3 VIEWS LEFT   Result Date: 03/12/2021 CLINICAL DATA:  On yesterday.  Left hip pain. EXAM: DG HIP (WITH OR WITHOUT PELVIS) 2-3V LEFT COMPARISON:  Left femur radiographs 01/29/2011. FINDINGS: Left hip is located. No acute fracture is present. Mild osteopenia noted. Degenerative changes are present in the lower lumbar spine. Atherosclerotic calcifications are present. IMPRESSION: 1. No acute abnormality. 2. Atherosclerosis. Electronically Signed   By: San Morelle M.D.   On: 03/12/2021 16:29    DG HIP UNILAT WITH PELVIS 2-3 VIEWS RIGHT   Result Date: 03/12/2021 CLINICAL DATA:  Syncopal episode.  Fall with bilateral hip pain. EXAM: DG HIP (WITH OR WITHOUT PELVIS) 2-3V RIGHT COMPARISON:  Radiographs 05/12/2011. FINDINGS: The bones are demineralized. There is no evidence of acute fracture, dislocation or femoral head avascular necrosis. Mild degenerative changes are present at both hips and sacroiliac joints. Vascular calcifications are noted. Possible ossification along the right iliopsoas tendon near its insertion. IMPRESSION: No evidence of acute fracture or dislocation. Electronically Signed   By: Richardean Sale M.D.   On: 03/12/2021 16:30   Imaging Results          Subjective: Patient seen and examined the bedside this morning.  Hemodynamically stable.  Comfortable.  Alert and awake, oriented to place.  Stable for discharge today.   Discharge Exam:      Vitals:    03/14/21 0618 03/14/21 0725  BP: 132/88 126/68  Pulse: 100 93  Resp: 18 18  Temp: 97.8 F (36.6 C) (!) 97.5 F (36.4 C)  SpO2: 98% 98%          Vitals:    03/13/21 2047 03/14/21 0000 03/14/21 0618 03/14/21 0725  BP: 120/78 128/74 132/88 126/68  Pulse: 100 98 100 93  Resp: '18 16 18 18  '$ Temp: 98.9 F (37.2 C) 98.7 F (37.1 C) 97.8 F (36.6 C) (!) 97.5 F (36.4 C)  TempSrc:   Oral      SpO2: 90% 96% 98% 98%  Weight:              General: Pt is alert, awake, not in acute distress Cardiovascular: RRR, S1/S2 +, no rubs, no gallops Respiratory: CTA bilaterally, no wheezing, no rhonchi Abdominal: Soft, NT, ND, bowel sounds +  Extremities: no edema, no cyanosis       The results of significant diagnostics from this hospitalization (including imaging, microbiology, ancillary and laboratory) are listed below for reference.       Microbiology:        Recent Results (from the past 240 hour(s))  Urine Culture     Status: Abnormal (Preliminary result)    Collection Time: 03/12/21  4:34 PM    Specimen: In/Out Cath Urine  Result Value Ref Range Status    Specimen Description     Final      IN/OUT CATH URINE Performed at [facility], [address] [place], [place] [address]      Special Requests     Final      NONE Performed at [facility], [place], [place] [address]      Culture 40,000 COLONIES/mL PSEUDOMONAS AERUGINOSA (A)   Final    Report Status PENDING   Incomplete  SARS CORONAVIRUS 2 (TAT 6-24 HRS) Nasopharyngeal     Status: None    Collection Time: 03/12/21  4:35 PM    Specimen: Nasopharyngeal  Result Value Ref Range Status    SARS Coronavirus 2 NEGATIVE NEGATIVE Final      Comment: (NOTE) SARS-CoV-2 target nucleic acids are NOT DETECTED.   The SARS-CoV-2 RNA is generally detectable in upper and lower respiratory specimens during the acute phase of infection. Negative results do not preclude SARS-CoV-2 infection,  do not rule out co-infections with other pathogens, and should not be used as the sole basis for treatment or other patient management decisions. Negative results must be combined with clinical observations, patient history, and epidemiological information. The expected result is Negative.   Fact Sheet for Patients: SugarRoll.be   Fact Sheet for Healthcare Providers: https://www.woods-mathews.com/   This test is not yet approved or cleared by the [place] FDA and  has been authorized for detection and/or diagnosis of SARS-CoV-2 by FDA under an Emergency Use Authorization (EUA). This EUA will remain  in effect (meaning this test can be used) for the duration of the COVID-19 declaration under Se ction 564(b)(1) of the Act, 21 U.S.C. section 360bbb-3(b)(1), unless the authorization is terminated or revoked sooner.   Performed at [facility], [place] [address] [place], [place] [address]        Labs: BNP (last 3 results) Recent Labs (within last 365 days)  No results for input(s): BNP in the last 8760 hours.   Basic Metabolic Panel: Last Labs       Recent Labs  Lab 03/12/21 1413 03/14/21 0348  NA 134* 139  K 3.7 3.7  CL 102 104  CO2 24 27  GLUCOSE 171* 104*  BUN 13 14  CREATININE 0.80 0.70  CALCIUM 8.4* 8.5*      Liver Function Tests: Last Labs      Recent Labs  Lab 03/12/21 1413  AST 23  ALT 13  ALKPHOS 87  BILITOT 0.7  PROT 6.2*  ALBUMIN 3.5      Last Labs   No results for input(s): LIPASE, AMYLASE in the last 168 hours.   Last Labs      Recent Labs  Lab 03/12/21 1634  AMMONIA 13      CBC: Last Labs       Recent Labs  Lab 03/12/21 1413 03/14/21 0348  WBC 13.4* 10.9*  NEUTROABS 8.5* 7.6  HGB 13.3 13.1  HCT 39.3 39.1  MCV 87.7 88.3  PLT  286 261      Cardiac Enzymes: Last Labs   No results for input(s): CKTOTAL, CKMB, CKMBINDEX, TROPONINI in the last 168 hours.   BNP: Last Labs    Invalid input(s): POCBNP   CBG: Last Labs          Recent Labs  Lab 03/13/21 0857 03/13/21 1155 03/13/21 1604 03/13/21 2049 03/14/21 0830  GLUCAP 115* 111* 95 90 93      D-Dimer Recent Labs (last 2 labs)   No results for input(s): DDIMER in the last 72 hours.   Hgb A1c Recent Labs (last 2 labs)      Recent Labs    03/12/21 1413  HGBA1C 5.8*      Lipid Profile Recent Labs (last 2 labs)   No results for input(s): CHOL, HDL, LDLCALC, TRIG, CHOLHDL, LDLDIRECT in the last 72 hours.   Thyroid function studies  Recent Labs (last 2 labs)   No results for input(s): TSH, T4TOTAL, T3FREE, THYROIDAB in the last 72 hours.   Invalid input(s): FREET3   Anemia work up [employer] (last 2 labs)   No results for input(s): VITAMINB12, FOLATE, FERRITIN, TIBC, IRON, RETICCTPCT in the last 72 hours.   Urinalysis Labs (Brief)          Component Value Date/Time    COLORURINE YELLOW 03/12/2021 1634    APPEARANCEUR CLOUDY (A) 03/12/2021 1634    LABSPEC 1.025 03/12/2021 1634    PHURINE 6.5 03/12/2021 1634    GLUCOSEU NEGATIVE 03/12/2021 1634    HGBUR TRACE (A) 03/12/2021 1634    BILIRUBINUR NEGATIVE 03/12/2021 1634    KETONESUR TRACE (A) 03/12/2021 1634    PROTEINUR TRACE (A) 03/12/2021 1634    NITRITE NEGATIVE 03/12/2021 1634    LEUKOCYTESUR MODERATE (A) 03/12/2021 1634      Sepsis Labs Last Labs   Invalid input(s): PROCALCITONIN,  WBC,  LACTICIDVEN   Microbiology        Recent Results (from the past 240 hour(s))  Urine Culture     Status: Abnormal (Preliminary result)    Collection Time: 03/12/21  4:34 PM    Specimen: In/Out Cath Urine  Result Value Ref Range Status    Specimen Description     Final      IN/OUT CATH URINE Performed at [facility], [address] [place], [place] [address]      Special Requests     Final      NONE Performed at [facility], [address], [place], [place] [address]      Culture 40,000 COLONIES/mL  PSEUDOMONAS AERUGINOSA (A)   Final    Report Status PENDING   Incomplete  SARS CORONAVIRUS 2 (TAT 6-24 HRS) Nasopharyngeal     Status: None    Collection Time: 03/12/21  4:35 PM    Specimen: Nasopharyngeal  Result Value Ref Range Status    SARS Coronavirus 2 NEGATIVE NEGATIVE Final      Comment: (NOTE) SARS-CoV-2 target nucleic acids are NOT DETECTED.   The SARS-CoV-2 RNA is generally detectable in upper and lower respiratory specimens during the acute phase of infection. Negative results do not preclude SARS-CoV-2 infection, do not rule out co-infections with other pathogens, and should not be used as the sole basis for treatment or other patient management decisions. Negative results must be combined with clinical observations, patient history, and epidemiological information. The expected result is Negative.   Fact Sheet for Patients: SugarRoll.be   Fact Sheet for Healthcare Providers: https://www.woods-mathews.com/  This test is not yet approved or cleared by the [place] and  has been authorized for detection and/or diagnosis of SARS-CoV-2 by FDA under an Emergency Use Authorization (EUA). This EUA will remain  in effect (meaning this test can be used) for the duration of the COVID-19 declaration under Se ction 564(b)(1) of the Act, 21 U.S.C. section 360bbb-3(b)(1), unless the authorization is terminated or revoked sooner.   Performed at [facility], [place] [address] [place], [place] [address]        Please note: You were cared for by a hospitalist during your hospital stay. Once you are discharged, your primary care physician will handle any further medical issues. Please note that NO REFILLS for any discharge medications will be authorized once you are discharged, as it is imperative that you return to your primary care physician (or establish a relationship with a primary care physician if you do not have one) for  your post hospital discharge needs so that they can reassess your need for medications and monitor your lab values.       Time coordinating discharge: 40 minutes   SIGNED:     Shelly Coss, MD       [facility] 03/14/2021, 10:53 AM Pager [phone]   If 7PM-7AM, please contact night-coverage [email or web address] Password [email or web address]          Note Details  Valda Lamb, MD File Time 03/14/2021 10:57 AM  Author Type Physician Status Signed  Last Editor Shelly Coss, [place] # [id number] Admit Date 03/12/2021

## 2021-03-15 NOTE — TOC Progression Note (Signed)
Transition of Care [facility]) - Progression Note    Patient Details  Name: Robin Lynn MRN: [id number] Date of Birth: March 27, 1919  [facility]) CM/SW [place], RN Phone Number: 03/15/2021, 10:18 AM  Clinical Narrative:   Patient will be transported to [facility] today at [place] by [facility]  Patient will be LTC, as insurance has denied SNF.  Seth Bake at [facility] and family aware.    Expected Discharge Plan: [place]    Expected Discharge Plan and Services Expected Discharge Plan: [place]   Discharge Planning Services: CM Consult   Living arrangements for the past 2 months: [place] Expected Discharge Date: 03/14/21                       Representative spoke with at DME Agency: n/a         Representative spoke with at [place]: n/a   Social Determinants of Health ([place]) Interventions    Readmission Risk Interventions No flowsheet data found.

## 2021-03-16 DIAGNOSIS — N390 Urinary tract infection, site not specified: Secondary | ICD-10-CM | POA: Diagnosis not present

## 2021-03-16 DIAGNOSIS — G309 Alzheimer's disease, unspecified: Secondary | ICD-10-CM | POA: Diagnosis not present

## 2021-03-16 DIAGNOSIS — M6259 Muscle wasting and atrophy, not elsewhere classified, multiple sites: Secondary | ICD-10-CM | POA: Diagnosis not present

## 2021-03-16 DIAGNOSIS — R4182 Altered mental status, unspecified: Secondary | ICD-10-CM | POA: Diagnosis not present

## 2021-03-16 DIAGNOSIS — B351 Tinea unguium: Secondary | ICD-10-CM | POA: Diagnosis not present

## 2021-04-11 DIAGNOSIS — I10 Essential (primary) hypertension: Secondary | ICD-10-CM | POA: Diagnosis not present

## 2021-04-11 DIAGNOSIS — M1990 Unspecified osteoarthritis, unspecified site: Secondary | ICD-10-CM | POA: Diagnosis not present

## 2021-04-11 DIAGNOSIS — F39 Unspecified mood [affective] disorder: Secondary | ICD-10-CM | POA: Diagnosis not present

## 2021-04-11 DIAGNOSIS — F0150 Vascular dementia without behavioral disturbance: Secondary | ICD-10-CM

## 2021-04-11 DIAGNOSIS — B029 Zoster without complications: Secondary | ICD-10-CM | POA: Diagnosis not present

## 2021-04-11 DIAGNOSIS — I69398 Other sequelae of cerebral infarction: Secondary | ICD-10-CM

## 2021-06-13 DIAGNOSIS — F39 Unspecified mood [affective] disorder: Secondary | ICD-10-CM | POA: Diagnosis not present

## 2021-06-13 DIAGNOSIS — I69915 Cognitive social or emotional deficit following unspecified cerebrovascular disease: Secondary | ICD-10-CM | POA: Diagnosis not present

## 2021-06-13 DIAGNOSIS — M159 Polyosteoarthritis, unspecified: Secondary | ICD-10-CM | POA: Diagnosis not present

## 2021-06-13 DIAGNOSIS — F0150 Vascular dementia without behavioral disturbance: Secondary | ICD-10-CM | POA: Diagnosis not present

## 2021-07-25 DIAGNOSIS — H5789 Other specified disorders of eye and adnexa: Secondary | ICD-10-CM

## 2021-08-15 DIAGNOSIS — F0150 Vascular dementia without behavioral disturbance: Secondary | ICD-10-CM

## 2021-08-15 DIAGNOSIS — I10 Essential (primary) hypertension: Secondary | ICD-10-CM

## 2021-08-15 DIAGNOSIS — M1990 Unspecified osteoarthritis, unspecified site: Secondary | ICD-10-CM

## 2021-08-15 DIAGNOSIS — I69359 Hemiplegia and hemiparesis following cerebral infarction affecting unspecified side: Secondary | ICD-10-CM

## 2021-08-15 DIAGNOSIS — F39 Unspecified mood [affective] disorder: Secondary | ICD-10-CM

## 2021-08-15 DIAGNOSIS — B029 Zoster without complications: Secondary | ICD-10-CM

## 2021-08-30 DIAGNOSIS — R55 Syncope and collapse: Secondary | ICD-10-CM | POA: Diagnosis not present

## 2021-10-01 ENCOUNTER — Telehealth: Payer: Self-pay | Admitting: Internal Medicine

## 2021-10-01 NOTE — Telephone Encounter (Signed)
Maryjean Morn from [facility] [phone] called regarding this patient.  Requesting Dr. Silvio Pate to sign off on the death certificate in [place] #[id number] ?Patient died on 2022-10-18  ?

## 2021-10-01 NOTE — Telephone Encounter (Signed)
Death certificate done

## 2021-10-06 DEATH — deceased

## 2023-01-13 IMAGING — CT CT CERVICAL SPINE W/O CM
4 of 5 series · 13 of 35 positions shown, 15 images · non-contrast
Comparison: Brain and C-spine CT 07/17/2016.

CLINICAL DATA: Patient status post fall.

EXAM:
CT HEAD WITHOUT CONTRAST
CT CERVICAL SPINE WITHOUT CONTRAST
TECHNIQUE: Multidetector CT imaging of the head and cervical spine was
performed following the standard protocol without intravenous
contrast. Multiplanar CT image reconstructions of the cervical spine
were also generated.

[Series 4: c spine soft · axial · 0.31mm/px · z∈[-320,-282]mm · 2 of 94 slices shown]
[im 19/94  soft-tissue]
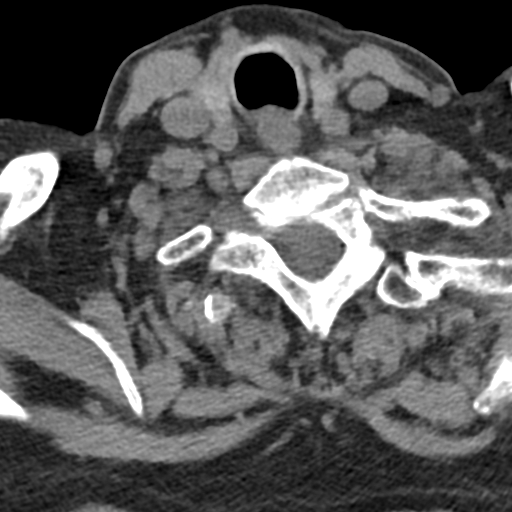
[im 38/94  soft-tissue]
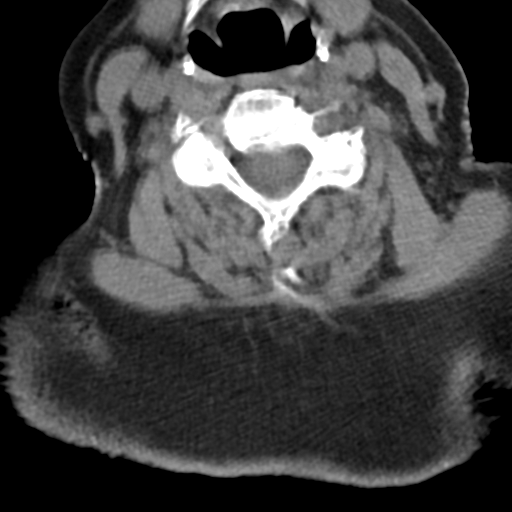

[Series 5: sagittal bone · sagittal · 0.29mm/px · 5 of 48 slices shown, 6 images]
[im 16/48  bone]
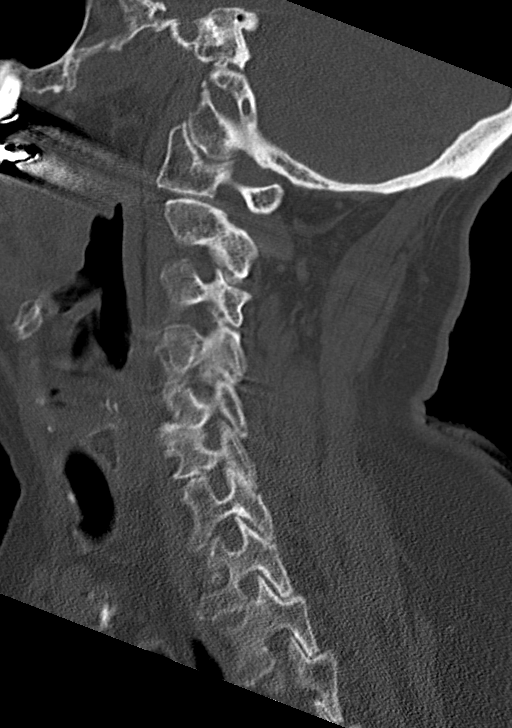
[im 20/48  bone]
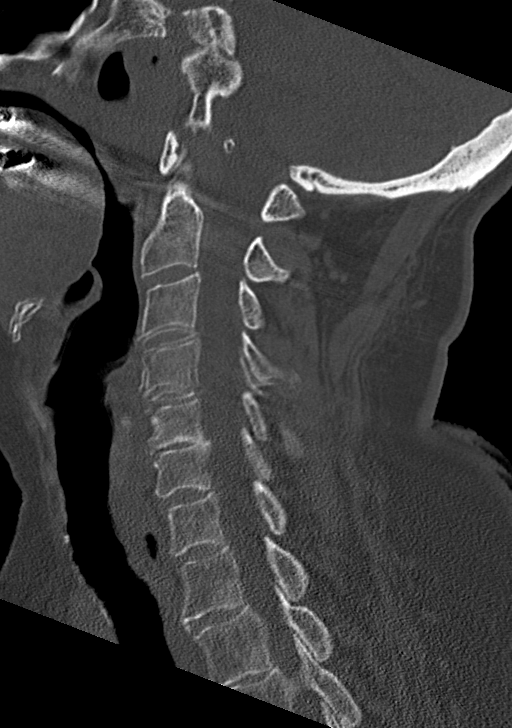
[im 24/48  soft-tissue]
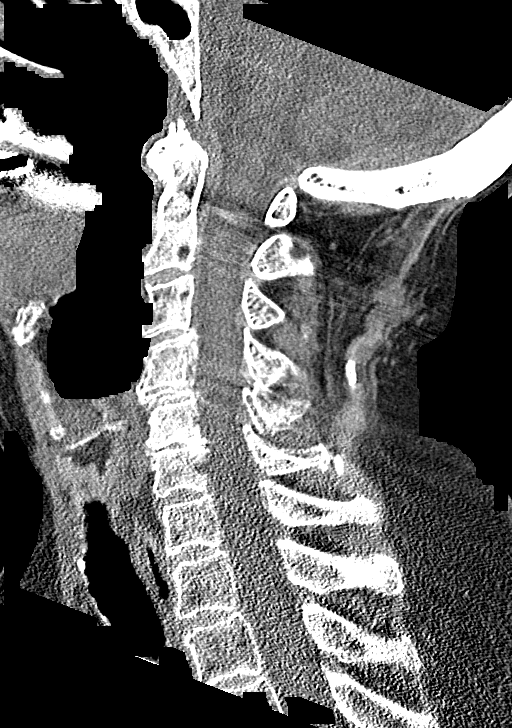
[im 24/48  bone]
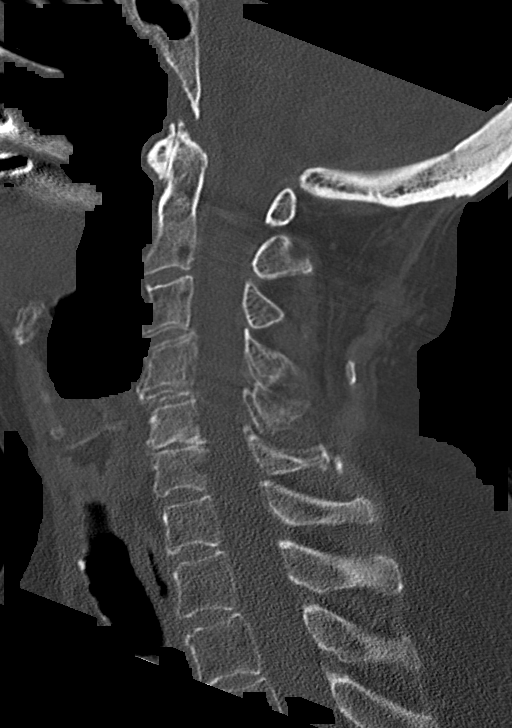
[im 28/48  bone]
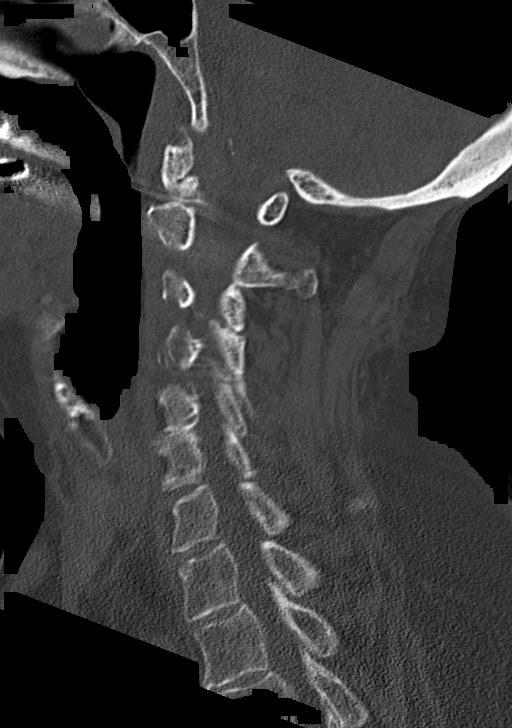
[im 32/48  bone]
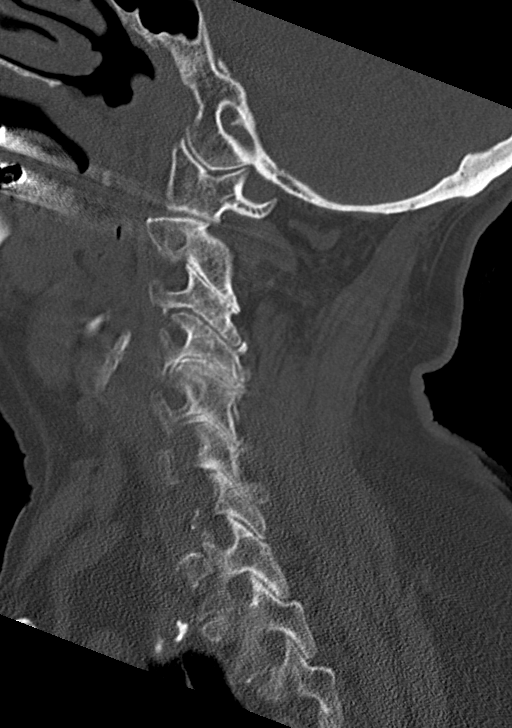

[Series 6: coronal bone · coronal · 0.27mm/px · 3 of 57 slices shown]
[im 12/57  bone]
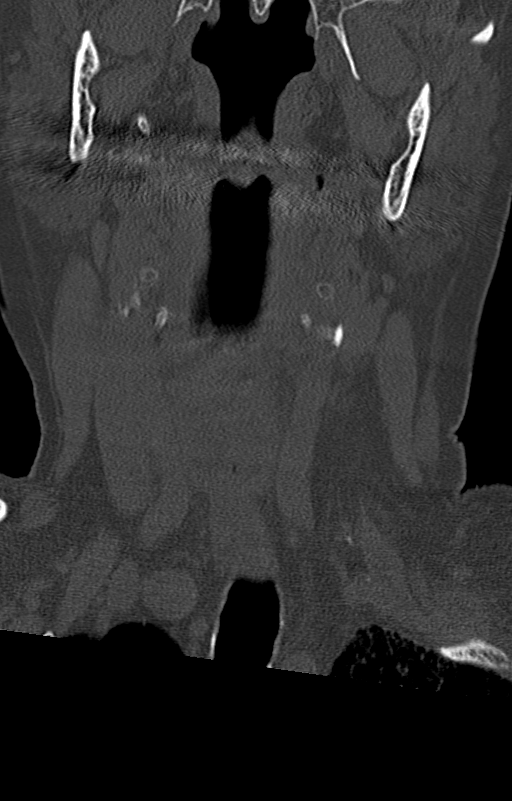
[im 23/57  bone]
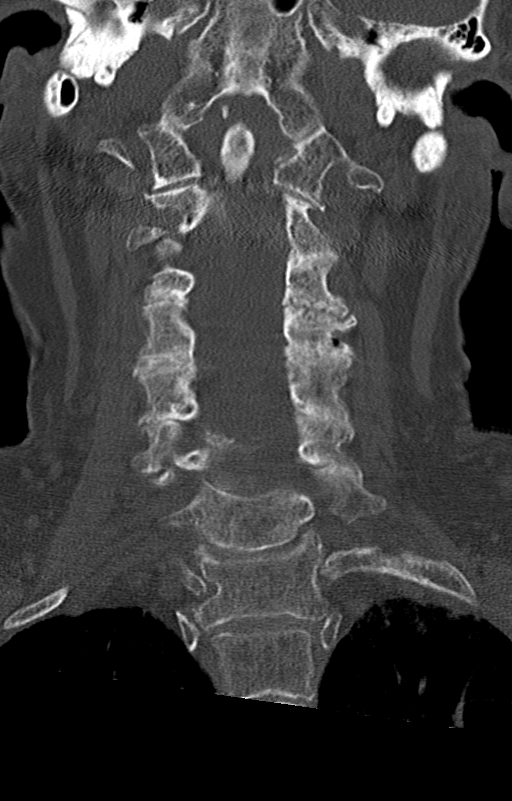
[im 34/57  bone]
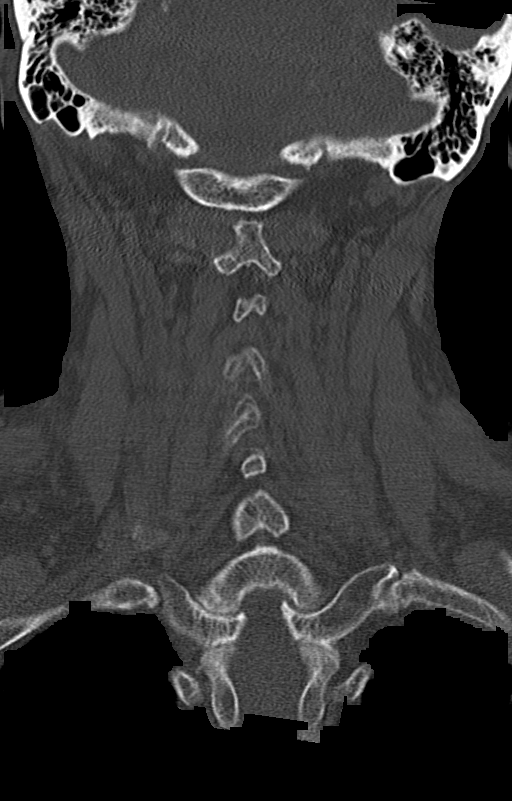

[Series 7: orthogonal bone · axial · 0.26mm/px · z∈[-322,-229]mm · 3 of 97 slices shown, 4 images]
[im 25/97  soft-tissue]
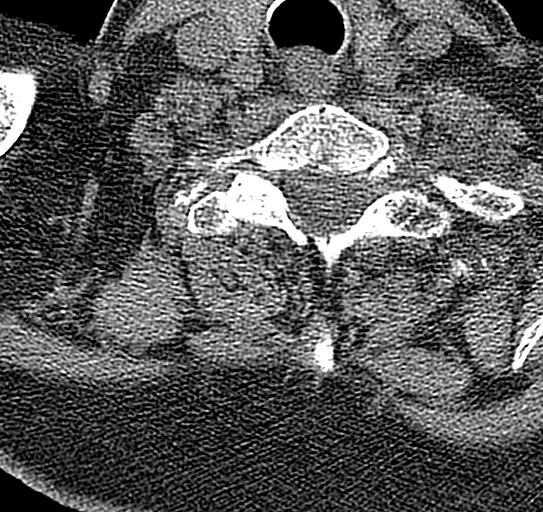
[im 25/97  bone]
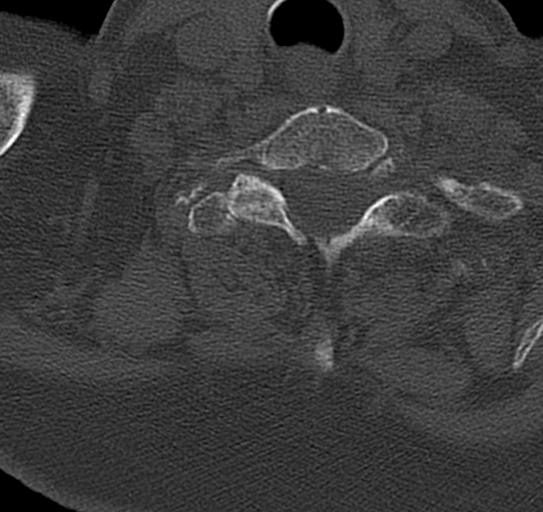
[im 49/97  bone]
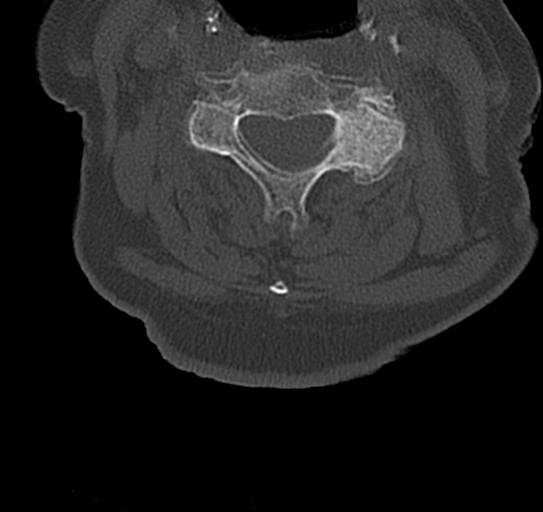
[im 73/97  bone]
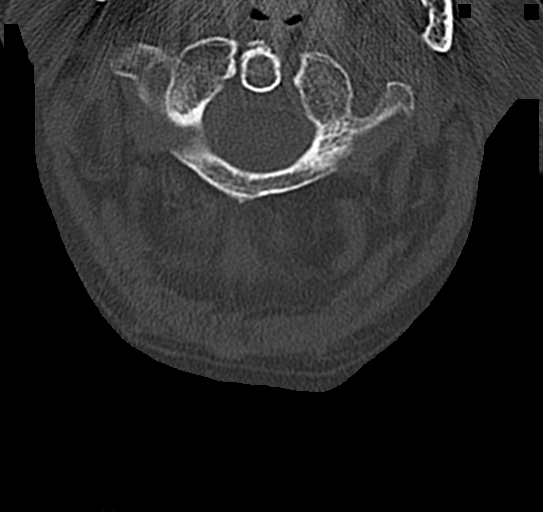

[13 of 35 positions shown; findings below may reference images not displayed]

FINDINGS: CT HEAD FINDINGS

Brain: Ventricles and sulci are appropriate for patient's age. No
evidence for acute cortically based infarct, intracranial
hemorrhage, mass lesion or mass-effect.

Vascular: Unremarkable

Skull: Intact.

Sinuses/Orbits: Polypoid mucosal thickening left maxillary sinus.
Remainder the paranasal sinuses are well aerated. Mastoid air cells
are unremarkable.

Other: None

CT CERVICAL SPINE FINDINGS

Alignment: Motion artifact limits evaluation. Normal anatomic
alignment.

Skull base and vertebrae: Intact.

Soft tissues and spinal canal: No prevertebral fluid or swelling. No
visible canal hematoma.

Disc levels: No acute fracture. C4-5 and C5-6 degenerative disc
disease. Multilevel facet degenerative changes.

Upper chest: Subpleural scarring within the left lung apex.

Other: None.
IMPRESSION: No acute intracranial process.

No acute cervical spine fracture

## 2023-01-13 IMAGING — CR DG HIP (WITH OR WITHOUT PELVIS) 2-3V*R*
1 series · 3 of 3 positions shown · non-contrast
Comparison: Radiographs 05/12/2011.

CLINICAL DATA: Syncopal episode.  Fall with bilateral hip pain.

EXAM:
DG HIP (WITH OR WITHOUT PELVIS) 2-3V RIGHT

[Series 1: dg hip unilat w or w/o pelvis 2-3 views  · non-contrast · 0.14mm/px · 3 of 3 slices shown]
[im 1/3]
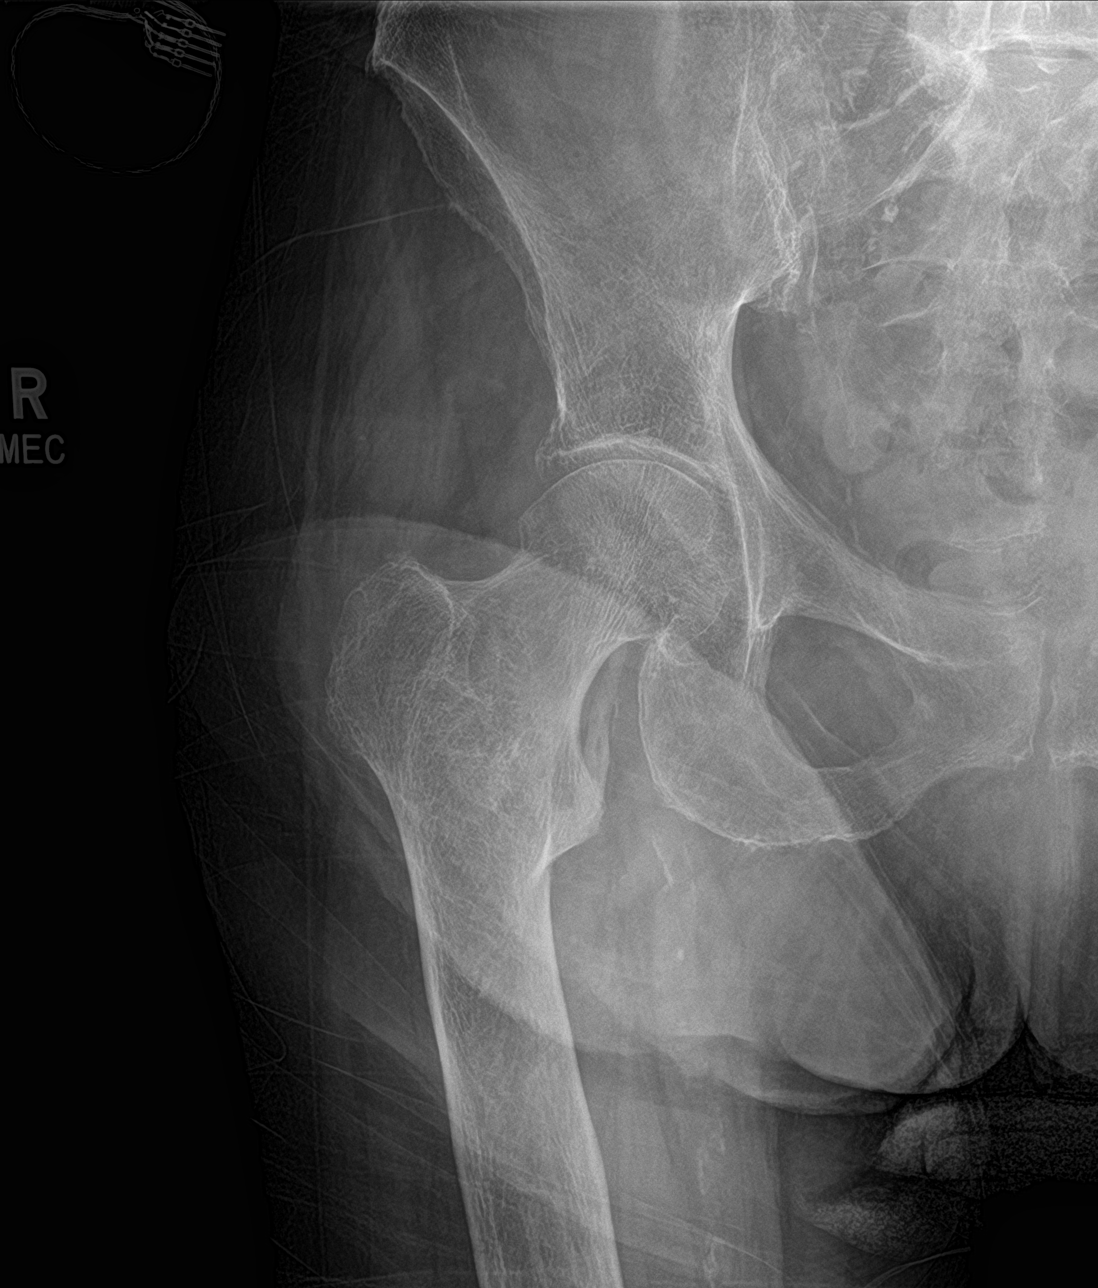
[im 2/3]
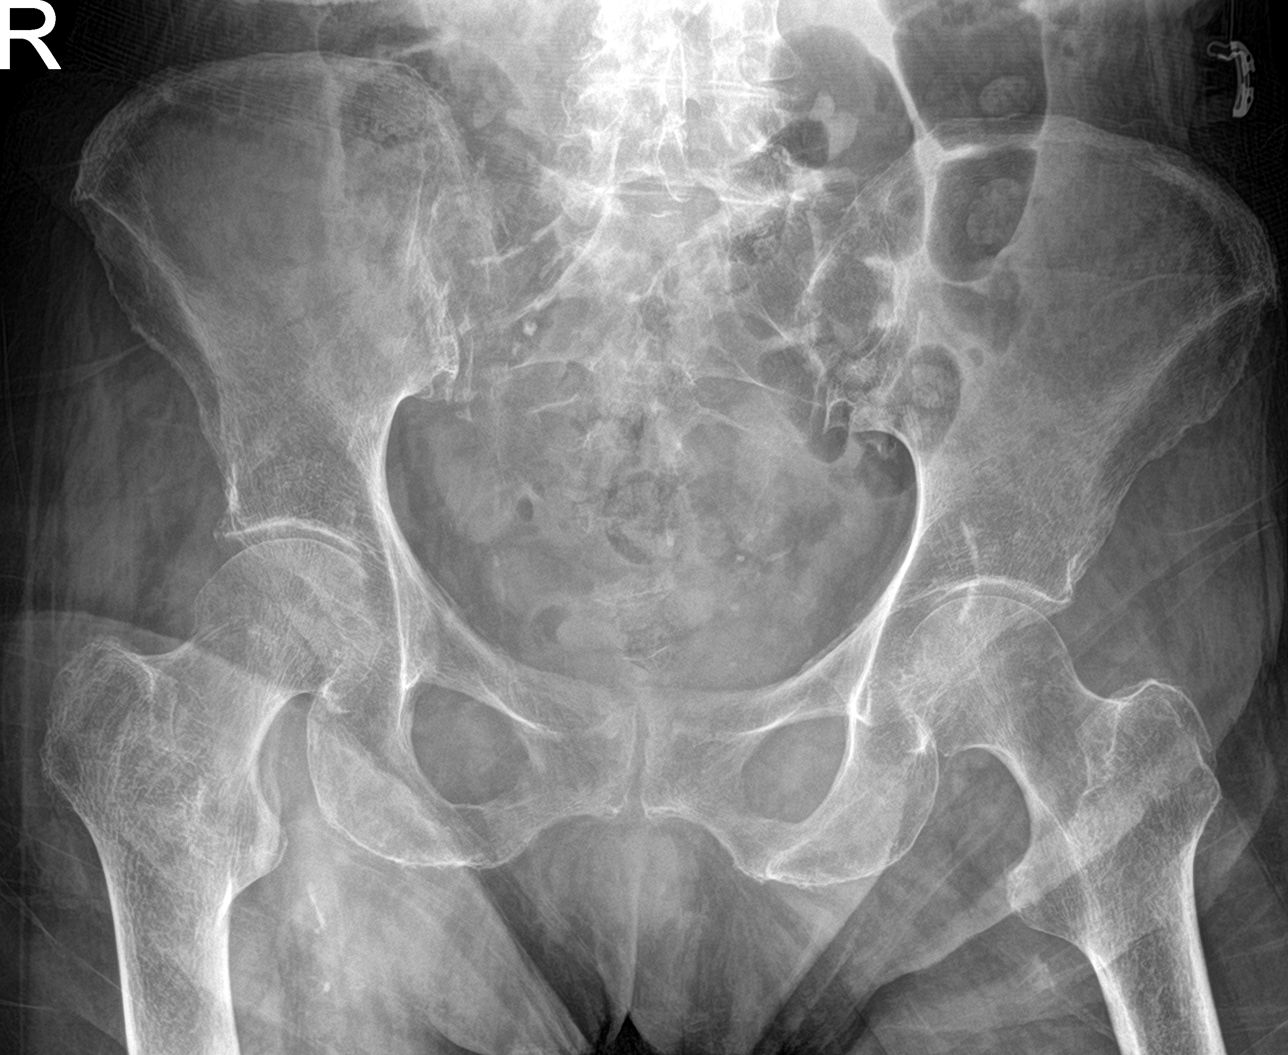
[im 3/3]
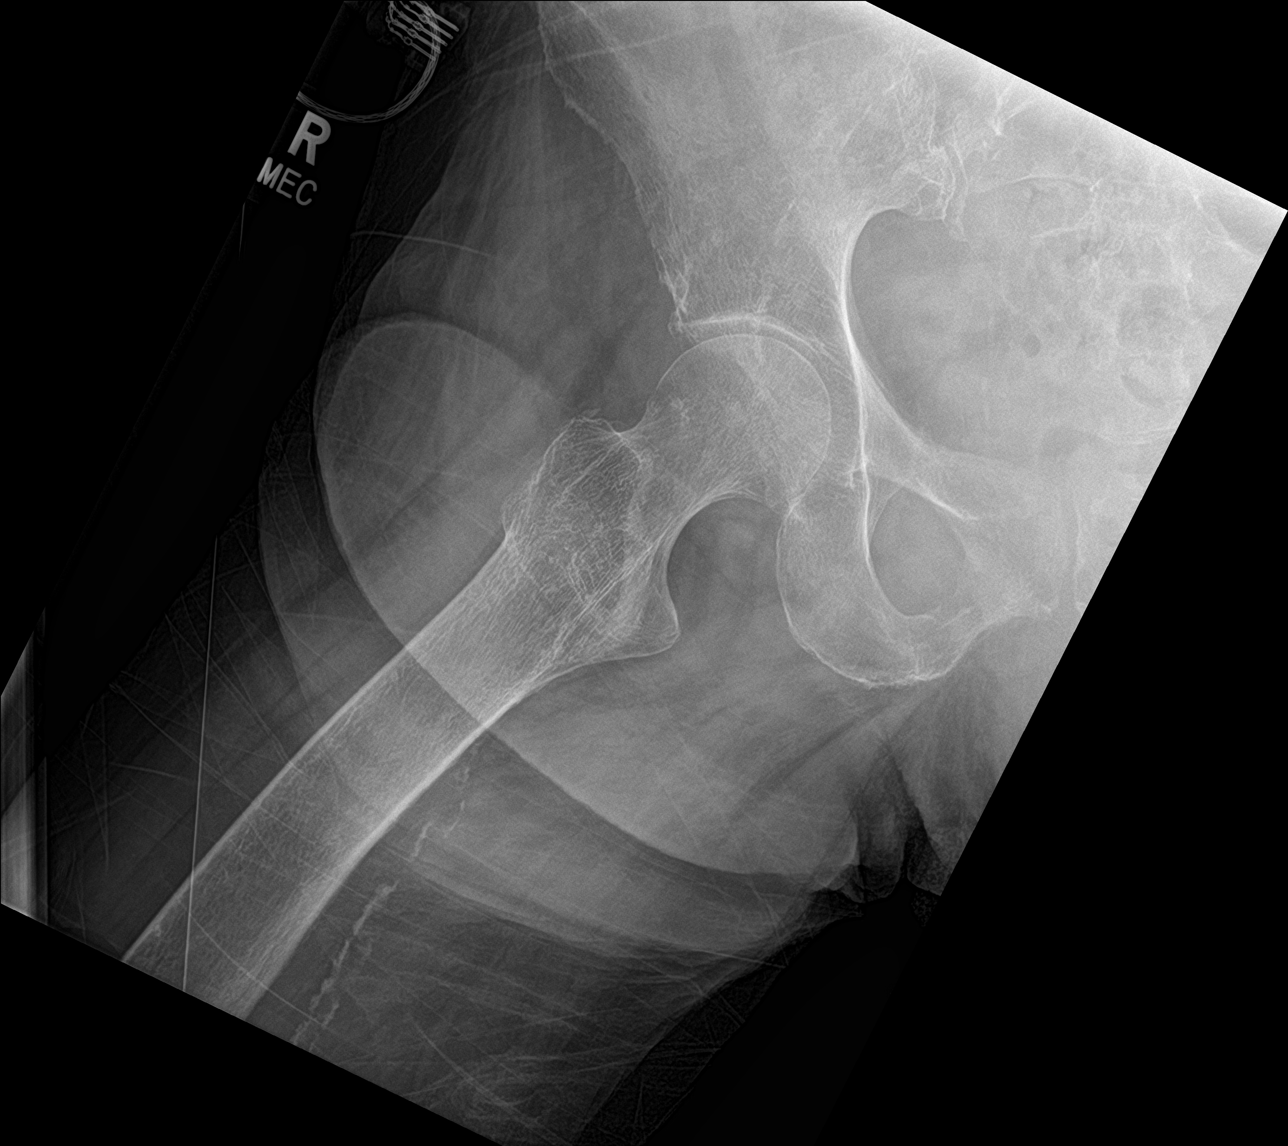

[3 of 3 positions shown; findings below may reference images not displayed]

FINDINGS: The bones are demineralized. There is no evidence of acute fracture,
dislocation or femoral head avascular necrosis. Mild degenerative
changes are present at both hips and sacroiliac joints. Vascular
calcifications are noted. Possible ossification along the right
iliopsoas tendon near its insertion.
IMPRESSION: No evidence of acute fracture or dislocation.
